# Patient Record
Sex: Female | Born: 1975 | Hispanic: Yes | Marital: Single | Smoking: Never smoker
Health system: Southern US, Community
[De-identification: ages and names within clinical notes are randomized; demographics above are authoritative.]

## PROBLEM LIST (undated history)

## (undated) DIAGNOSIS — G8929 Other chronic pain: Secondary | ICD-10-CM

## (undated) DIAGNOSIS — R51 Headache: Secondary | ICD-10-CM

## (undated) DIAGNOSIS — Q8500 Neurofibromatosis, unspecified: Secondary | ICD-10-CM

## (undated) HISTORY — PX: OVARY SURGERY: SHX727

---

## 2016-12-18 ENCOUNTER — Emergency Department: Payer: Medicaid Other

## 2016-12-18 ENCOUNTER — Observation Stay
Admission: EM | Admit: 2016-12-18 | Discharge: 2016-12-21 | Disposition: A | Discharge status: Home / Self Care | Payer: Medicaid Other | Attending: Internal Medicine | Admitting: Internal Medicine

## 2016-12-18 DIAGNOSIS — R109 Unspecified abdominal pain: Secondary | ICD-10-CM | POA: Diagnosis not present

## 2016-12-18 DIAGNOSIS — R9431 Abnormal electrocardiogram [ECG] [EKG]: Secondary | ICD-10-CM | POA: Insufficient documentation

## 2016-12-18 DIAGNOSIS — R079 Chest pain, unspecified: Secondary | ICD-10-CM | POA: Diagnosis present

## 2016-12-18 DIAGNOSIS — Q8500 Neurofibromatosis, unspecified: Secondary | ICD-10-CM | POA: Diagnosis not present

## 2016-12-18 DIAGNOSIS — R0789 Other chest pain: Secondary | ICD-10-CM | POA: Diagnosis not present

## 2016-12-18 DIAGNOSIS — M25511 Pain in right shoulder: Secondary | ICD-10-CM | POA: Diagnosis present

## 2016-12-18 DIAGNOSIS — R51 Headache: Secondary | ICD-10-CM | POA: Diagnosis not present

## 2016-12-18 HISTORY — DX: Neurofibromatosis, unspecified: Q85.00

## 2016-12-18 LAB — BASIC METABOLIC PANEL
Anion gap: 10 (ref 5–15)
BUN: 18 mg/dL (ref 6–20)
CALCIUM: 9.6 mg/dL (ref 8.9–10.3)
CO2: 19 mmol/L — ABNORMAL LOW (ref 22–32)
CREATININE: 0.63 mg/dL (ref 0.44–1.00)
Chloride: 112 mmol/L — ABNORMAL HIGH (ref 101–111)
GFR calc Af Amer: >60 mL/min (ref >60)
Glucose, Bld: 103 mg/dL — ABNORMAL HIGH (ref 65–99)
Potassium: 3.3 mmol/L — ABNORMAL LOW (ref 3.5–5.1)
SODIUM: 141 mmol/L (ref 135–145)

## 2016-12-18 LAB — HEPATIC FUNCTION PANEL
ALT: 15 U/L (ref 14–54)
AST: 21 U/L (ref 15–41)
Albumin: 5 g/dL (ref 3.5–5.0)
Alkaline Phosphatase: 63 U/L (ref 38–126)
BILIRUBIN TOTAL: 0.6 mg/dL (ref 0.3–1.2)
Total Protein: 8.3 g/dL — ABNORMAL HIGH (ref 6.5–8.1)

## 2016-12-18 LAB — HCG, QUANTITATIVE, PREGNANCY

## 2016-12-18 LAB — CBC
HCT: 43 % (ref 35.0–47.0)
Hemoglobin: 14.5 g/dL (ref 12.0–16.0)
MCH: 30.3 pg (ref 26.0–34.0)
MCHC: 33.7 g/dL (ref 32.0–36.0)
MCV: 89.9 fL (ref 80.0–100.0)
PLATELETS: 381 10*3/uL (ref 150–440)
RBC: 4.78 MIL/uL (ref 3.80–5.20)
RDW: 13.7 % (ref 11.5–14.5)
WBC: 11.3 10*3/uL — AB (ref 3.6–11.0)

## 2016-12-18 LAB — LIPASE, BLOOD: Lipase: 23 U/L (ref 11–51)

## 2016-12-18 LAB — FIBRIN DERIVATIVES D-DIMER (ARMC ONLY): FIBRIN DERIVATIVES D-DIMER (ARMC): 94.95 (ref 0.00–499.00)

## 2016-12-18 LAB — TROPONIN I: Troponin I: <0.03 ng/mL (ref <0.03)

## 2016-12-18 MED ORDER — ONDANSETRON HCL 4 MG/2ML IJ SOLN
4.0000 mg | Freq: Once | INTRAMUSCULAR | Status: AC
  Administered 2016-12-18: 4 mg via INTRAVENOUS

## 2016-12-18 MED ORDER — ONDANSETRON 4 MG PO TBDP: 4.0000 mg | ORAL_TABLET | Freq: Once | ORAL | Status: DC

## 2016-12-18 MED ORDER — MORPHINE SULFATE (PF) 2 MG/ML IV SOLN
2.0000 mg | Freq: Once | INTRAVENOUS | Status: AC
  Administered 2016-12-18: 2 mg via INTRAVENOUS
  Filled 2016-12-18: qty 1

## 2016-12-18 MED ORDER — ASPIRIN 81 MG PO CHEW
324.0000 mg | CHEWABLE_TABLET | Freq: Once | ORAL | Status: AC
  Administered 2016-12-18: 324 mg via ORAL
  Filled 2016-12-18: qty 4

## 2016-12-18 MED ORDER — ONDANSETRON HCL 4 MG/2ML IJ SOLN
INTRAMUSCULAR | Status: AC
  Administered 2016-12-18: 4 mg via INTRAVENOUS
  Filled 2016-12-18: qty 2

## 2016-12-18 NOTE — ED Notes (Signed)
Pt went to CT

## 2016-12-18 NOTE — ED Provider Notes (Signed)
Reviewed triage EKG. No STEMI, but concerning EKG abnormalities with no previous for comparison. I requested triage team placed the patient to an acute care treatment bed as soon as bed becomes available   Sharyn CreamerMark Lasondra Hodgkins, MD 12/18/16 2203

## 2016-12-18 NOTE — ED Notes (Signed)
Pt returned from CT °

## 2016-12-18 NOTE — ED Provider Notes (Signed)
[facility] Emergency Department Provider Note   ____________________________________________   None    (approximate)  I have reviewed the triage vital signs and the nursing notes.  Spanish interpreter, [employer] utilize  HISTORY  Chief Complaint Shoulder Pain (right) and Near Syncope    HPI Denise Curry is a 41 y.o. female pertinent history of migraines  Patient reports that for about a week she's been noticing some pain and discomfort in her right shoulder area. Today the pain worsened. Nothing seemed to make it worse. She reports pain is in her right upper chest that radiates down into her arm with a tingling sensation. She also gets frequent "migraines" and is followed by her primary for this, reports she began having a throbbing headache today as well, but reports she is here because of pain in her right upper chest and arm  Social reports some slight discomfort in her right upper abdomen. Occasional nausea. She felt at one point earlier that she was very dizzy and felt as though she could pass out.  She does experience chest pain somewhat hard describe, sharp and heavy at times in the right upper chest  Denies pregnancy. No vaginal bleeding.  No shortness of breath. No leg swelling. No recent long trips or travels. No history of any blood clots.  Past Medical History:  Diagnosis Date  . 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 Chest pain 12/19/2016    Past Surgical History:  Procedure Laterality Date  . CESAREAN SECTION      Prior to Admission medications   Medication Sig Start Date End Date Taking? Authorizing Provider  cyclobenzaprine (FLEXERIL) 5 MG tablet Take 1 tablet (5 mg total) by mouth 3 (three) times daily as needed for muscle spasms. 12/21/16   Altamese Dilling, MD    Allergies Patient has no known allergies.  Family History  Problem Relation Age of Onset  .  Neurofibromatosis Son     Social History Social History  Substance Use Topics  . Smoking status: Never Smoker  . Smokeless tobacco: Never Used  . Alcohol use No    Review of Systems Constitutional: No fever/chills Eyes: No visual changes. ENT: No sore throat. Cardiovascular: See history of present illness Respiratory: Denies shortness of breath. Gastrointestinal: No vomiting.  No diarrhea.  No constipation. Genitourinary: Negative for dysuria. Musculoskeletal: Negative for back pain. Skin: Negative for rash. Neurological: Negative for headaches, focal weakness or numbness.  10-point ROS otherwise negative.  ____________________________________________   PHYSICAL EXAM:  VITAL SIGNS: ED Triage Vitals  Enc Vitals Group     BP 12/18/16 2204 134/87     Pulse Rate 12/18/16 2204 92     Resp 12/18/16 2204 18     Temp 12/18/16 2204 98.4 F (36.9 C)     Temp Source 12/18/16 2204 Oral     SpO2 12/18/16 2204 98 %     Weight 12/18/16 2158 100 lb (45.4 kg)     Height 12/18/16 2158 5' (1.524 m)     Head Circumference --      Peak Flow --      Pain Score 12/18/16 2157 10     Pain Loc --      Pain Edu? --      Excl. in GC? --     Constitutional: Alert and oriented. Well appearing and in no acute distress. Eyes: Conjunctivae are normal. PERRL. EOMI. Head: Atraumatic. Nose: No  congestion/rhinnorhea. Mouth/Throat: Mucous membranes are moist.  Oropharynx non-erythematous. Neck: No stridor.   Cardiovascular: Normal rate, regular rhythm. Grossly normal heart sounds.  Good peripheral circulation. Respiratory: Normal respiratory effort.  No retractions. Lungs CTAB. Gastrointestinal: Soft and nontender. No distention.  Musculoskeletal: No lower extremity tenderness nor edema.  No joint effusions.Strong, palpable radial pulses bilateral. Right hand Median, ulnar, radial motor intact. Cap refill less than 2 seconds all digits. Strong radial pulse. 5 out of 5 strength throughout the  hand intrinsics, flexion and extension at the wrist. No evidence of trauma.  Left hand Median, ulnar, radial motor intact. Cap refill less than 2 seconds all digits. Strong radial pulse. 5 out of 5 strength throughout the hand intrinsics, flexion and extension at the wrist. No evidence of trauma. ____________________________________________  Neurologic:  Normal speech and language. No gross focal neurologic deficits are appreciated. Skin:  Skin is warm, dry and intact. No rash noted. Psychiatric: Mood and affect are normal. Speech and behavior are normal.  ____________________________________________   LABS (all labs ordered are listed, but only abnormal results are displayed)  Labs Reviewed  BASIC METABOLIC PANEL - Abnormal; Notable for the following:       Result Value   Potassium 3.3 (*)    Chloride 112 (*)    CO2 19 (*)    Glucose, Bld 103 (*)    All other components within normal limits  CBC - Abnormal; Notable for the following:    WBC 11.3 (*)    All other components within normal limits  URINALYSIS, COMPLETE (UACMP) WITH MICROSCOPIC - Abnormal; Notable for the following:    Color, Urine AMBER (*)    APPearance HAZY (*)    Ketones, ur 20 (*)    Protein, ur 30 (*)    Squamous Epithelial / LPF 0-5 (*)    All other components within normal limits  HEPATIC FUNCTION PANEL - Abnormal; Notable for the following:    Total Protein 8.3 (*)    Bilirubin, Direct <0.1 (*)    All other components within normal limits  TROPONIN I  HCG, QUANTITATIVE, PREGNANCY  FIBRIN DERIVATIVES D-DIMER ([facility] ONLY)  LIPASE, BLOOD  HIV ANTIBODY (ROUTINE TESTING)  TSH  HEMOGLOBIN A1C  TROPONIN I  TROPONIN I  TROPONIN I  CBC  CREATININE, SERUM   ____________________________________________  EKG  Reviewed and interpreted by me at 2158 Ventricular rate 80 QRS 80 QTc 440 Normal sinus rhythm, notable T-wave inversions in inferior and anteroseptal and slight lateral distribution. No previous  for comparison, concerning for possible ischemic abnormality. Evidence of STEMI. ____________________________________________  RADIOLOGY  Dg Chest 2 View  Result Date: 12/19/2016 CLINICAL DATA:  Dizziness and RIGHT chest pain. Pain radiates to RIGHT arm for 1 day. History of neurofibromatosis. EXAM: CHEST  2 VIEW COMPARISON:  None. FINDINGS: Cardiomediastinal silhouette is normal. No pleural effusions or focal consolidations. Trachea projects midline and there is no pneumothorax. Soft tissue planes and included osseous structures are non-suspicious. IMPRESSION: Normal chest. Electronically Signed   By: Awilda Metro M.D.   On: 12/19/2016 00:21   US Abdomen Limited Ruq  Result Date: 12/19/2016 CLINICAL DATA:  Right shoulder pain with nausea vomiting and dizziness EXAM: US ABDOMEN LIMITED - RIGHT UPPER QUADRANT COMPARISON:  None. FINDINGS: Gallbladder: No gallstones or wall thickening visualized. No sonographic Murphy sign noted by sonographer. Common bile duct: Diameter: Borderline enlarged at 6.7 mm Liver: No focal lesion identified. Within normal limits in parenchymal echogenicity. IMPRESSION: 1. Negative for gallstones or cholecystitis 2.  Borderline enlarged common bile duct, suggest correlation with laboratory values. Electronically Signed   By: Jasmine PangKim  Fujinaga M.D.   On: 12/19/2016 01:56   ____________________________________________   PROCEDURES  Procedure(s) performed: None  Procedures  Critical Care performed: No  ____________________________________________   INITIAL IMPRESSION / ASSESSMENT AND PLAN / ED COURSE  Pertinent labs & imaging results that were available during my care of the patient were reviewed by me and considered in my medical decision making (see chart for details).  Patient has for evaluation of right arm discomfort somewhat off and on for at least a week, now with worsening today located in the right upper chest. Unclear if this is associated as his symptoms  are very right-sided, but her EKG is notably abnormal. Possibly indicative of coronary disease or other process leading to a acute EKG or melena. She has no risk factors noted for pulmonary embolism, her d-dimer is negative, and she is also notably PERC negative as well again the risk of pulmonary wasn't very low. Denies pleuritic pain. Does have some abdominal discomfort in the right upper quadrant. We'll evaluate with right upper quadrant ultrasound LFTs, to evaluate for referred discomfort.  ----------------------------------------- 11:31 PM on 12/18/2016 -----------------------------------------  Patient is resting, mild ongoing right sided arm discomfort. No evidence of any neurologic deficits. Moves extremities well without loss of sensation. Normal strength. I have ordered aspirin. Chest x-ray and right upper quadrant ultrasound are pending.  Clinical Course as of Dec 24 1613  Sat Dec 19, 2016  0216 1. Negative for gallstones or cholecystitis 2. Borderline enlarged common bile duct, suggest correlation with laboratory values.   US Abdomen Limited RUQ [AW]  0218 Normal chest. DG Chest 2 View [AW]    Clinical Course User Index [AW] Rebecka ApleyWebster, Allison P, MD   Patient admitted due to abnormal EKG, associated symptoms including potential radiation of shoulder pain  ____________________________________________   FINAL CLINICAL IMPRESSION(S) / ED DIAGNOSES  Final diagnoses:  Abdominal pain  Right shoulder pain, unspecified chronicity      NEW MEDICATIONS STARTED DURING THIS VISIT:  Discharge Medication List as of 12/21/2016  5:24 PM       Note:  This document was prepared using Dragon voice recognition software and may include unintentional dictation errors.     Sharyn CreamerQuale, Mckaela Howley, MD 12/24/16 [phone]

## 2016-12-18 NOTE — ED Triage Notes (Signed)
Patient c/o headache, right shoulder pain, dizziness, near syncope, N/V.

## 2016-12-19 ENCOUNTER — Emergency Department: Payer: Medicaid Other

## 2016-12-19 ENCOUNTER — Observation Stay (HOSPITAL_BASED_OUTPATIENT_CLINIC_OR_DEPARTMENT_OTHER)
Admit: 2016-12-19 | Discharge: 2016-12-19 | Disposition: A | Discharge status: Home / Self Care | Payer: Medicaid Other | Attending: Internal Medicine | Admitting: Internal Medicine

## 2016-12-19 ENCOUNTER — Encounter: Payer: Self-pay | Admitting: Emergency Medicine

## 2016-12-19 DIAGNOSIS — R9431 Abnormal electrocardiogram [ECG] [EKG]: Secondary | ICD-10-CM | POA: Diagnosis not present

## 2016-12-19 DIAGNOSIS — R0789 Other chest pain: Secondary | ICD-10-CM

## 2016-12-19 DIAGNOSIS — R079 Chest pain, unspecified: Secondary | ICD-10-CM | POA: Diagnosis present

## 2016-12-19 DIAGNOSIS — R0602 Shortness of breath: Secondary | ICD-10-CM | POA: Diagnosis not present

## 2016-12-19 LAB — URINALYSIS, COMPLETE (UACMP) WITH MICROSCOPIC
Bacteria, UA: NONE SEEN
Bilirubin Urine: NEGATIVE
Glucose, UA: NEGATIVE mg/dL
Hgb urine dipstick: NEGATIVE
Ketones, ur: 20 mg/dL — AB
Leukocytes, UA: NEGATIVE
Nitrite: NEGATIVE
Protein, ur: 30 mg/dL — AB
Specific Gravity, Urine: 1.028 (ref 1.005–1.030)
pH: 5 (ref 5.0–8.0)

## 2016-12-19 LAB — TROPONIN I
Troponin I: <0.03 ng/mL (ref <0.03)
Troponin I: <0.03 ng/mL (ref <0.03)
Troponin I: <0.03 ng/mL (ref <0.03)

## 2016-12-19 LAB — ECHOCARDIOGRAM COMPLETE
Height: 60 in
Weight: 1584 oz

## 2016-12-19 LAB — TSH: TSH: 1.061 u[IU]/mL (ref 0.350–4.500)

## 2016-12-19 MED ORDER — CYCLOBENZAPRINE HCL 5 MG PO TABS: 5.0000 mg | ORAL_TABLET | Freq: Three times a day (TID) | ORAL | 0 refills | Status: DC | PRN

## 2016-12-19 MED ORDER — CYCLOBENZAPRINE HCL 10 MG PO TABS
5.0000 mg | ORAL_TABLET | Freq: Three times a day (TID) | ORAL | Status: DC | PRN
  Administered 2016-12-19 – 2016-12-20 (×2): 5 mg via ORAL
  Filled 2016-12-19 (×2): qty 1

## 2016-12-19 MED ORDER — ONDANSETRON HCL 4 MG/2ML IJ SOLN: 4.0000 mg | Freq: Four times a day (QID) | INTRAMUSCULAR | Status: DC | PRN

## 2016-12-19 MED ORDER — SODIUM CHLORIDE 0.9 % IV SOLN
INTRAVENOUS | Status: DC
  Administered 2016-12-19 – 2016-12-21 (×5): via INTRAVENOUS

## 2016-12-19 MED ORDER — ACETAMINOPHEN 325 MG PO TABS
650.0000 mg | ORAL_TABLET | Freq: Four times a day (QID) | ORAL | Status: DC | PRN
  Administered 2016-12-19 – 2016-12-21 (×6): 650 mg via ORAL
  Filled 2016-12-19 (×6): qty 2

## 2016-12-19 MED ORDER — ENOXAPARIN SODIUM 40 MG/0.4ML ~~LOC~~ SOLN
40.0000 mg | SUBCUTANEOUS | Status: DC
  Administered 2016-12-19 – 2016-12-20 (×2): 40 mg via SUBCUTANEOUS
  Filled 2016-12-19 (×2): qty 0.4

## 2016-12-19 MED ORDER — ONDANSETRON HCL 4 MG PO TABS: 4.0000 mg | ORAL_TABLET | Freq: Four times a day (QID) | ORAL | Status: DC | PRN

## 2016-12-19 MED ORDER — DOCUSATE SODIUM 100 MG PO CAPS
100.0000 mg | ORAL_CAPSULE | Freq: Two times a day (BID) | ORAL | Status: DC
  Administered 2016-12-19 – 2016-12-20 (×3): 100 mg via ORAL
  Filled 2016-12-19 (×4): qty 1

## 2016-12-19 MED ORDER — ACETAMINOPHEN 650 MG RE SUPP: 650.0000 mg | Freq: Four times a day (QID) | RECTAL | Status: DC | PRN

## 2016-12-19 MED ORDER — ASPIRIN EC 81 MG PO TBEC
81.0000 mg | DELAYED_RELEASE_TABLET | Freq: Every day | ORAL | Status: DC
  Administered 2016-12-19 – 2016-12-20 (×2): 81 mg via ORAL
  Filled 2016-12-19 (×2): qty 1

## 2016-12-19 NOTE — Progress Notes (Signed)
*  PRELIMINARY RESULTS* Echocardiogram 2D Echocardiogram has been performed.  Denise Curry 12/19/2016, 10:40 AM

## 2016-12-19 NOTE — H&P (Signed)
Denise Curry is an 41 y.o. female.   Chief Complaint: Shoulder pain HPI: The patient with past medical history of neurofibromatosis presents to the emergency department complaining of right shoulder pain. The patient states that her right shoulder began to hurt 4 days ago. The pain in her right shoulder extends into her chest and radiates down her right arm. The patient states that her pain is 10 out of 10 in severity and associated at times with nausea, vomiting and dizziness. Her pain has waxed and waned but never gone away for 5 days now. She states that moving her right arm makes her pain worse but nothing makes it better. The pain is present with normal activity or with rest area the patient denies any strenuous activity lately. She admits to occasional shortness of breath with or without exertion. She reports feeling faint at times when her shortness of breath is greatest. Notably, all of her symptoms began days ago with a right frontal headache that has not gone away. In the emergency department the patient had an ultrasound of her abdomen to rule out cholecystitis given her history of nausea and vomiting. She also had a head CT which showed no acute intracranial abnormality. Troponins have been negative during ED evaluation but her EKG shows ST inversions of her precordial leads. For this reason as well as ongoing chest pain the emergency department staff called the hospitalist service for admission.  Past Medical History:  Diagnosis Date  . 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 CESAREAN SECTION      Family History  Problem Relation Age of Onset  . Neurofibromatosis Son    Social History:  reports that she has never smoked. She has never used smokeless tobacco. She reports that she does not drink alcohol. Her drug history is not on file.  Allergies: No Known Allergies  No prescriptions prior to admission.    Results for orders  placed or performed during the hospital encounter of 12/18/16 (from the past 48 hour(s))  Basic metabolic panel     Status: Abnormal   Collection Time: 12/18/16 10:08 PM  Result Value Ref Range   Sodium 141 135 - 145 mmol/L   Potassium 3.3 (L) 3.5 - 5.1 mmol/L   Chloride 112 (H) 101 - 111 mmol/L   CO2 19 (L) 22 - 32 mmol/L   Glucose, Bld 103 (H) 65 - 99 mg/dL   BUN 18 6 - 20 mg/dL   Creatinine, Ser 0.63 0.44 - 1.00 mg/dL   Calcium 9.6 8.9 - 10.3 mg/dL   GFR calc non Af Amer >60 >60 mL/min   GFR calc Af Amer >60 >60 mL/min    Comment: (NOTE) The eGFR has been calculated using the CKD EPI equation. This calculation has not been validated in all clinical situations. eGFR's persistently <60 mL/min signify possible Chronic Kidney Disease.    Anion gap 10 5 - 15  CBC     Status: Abnormal   Collection Time: 12/18/16 10:08 PM  Result Value Ref Range   WBC 11.3 (H) 3.6 - 11.0 K/uL   RBC 4.78 3.80 - 5.20 MIL/uL   Hemoglobin 14.5 12.0 - 16.0 g/dL   HCT 43.0 35.0 - 47.0 %   MCV 89.9 80.0 - 100.0 fL   MCH 30.3 26.0 - 34.0 pg   MCHC 33.7 32.0 - 36.0 g/dL   RDW 13.7 11.5 - 14.5 %   Platelets 381 150 - 440  K/uL  Troponin I     Status: None   Collection Time: 12/18/16 10:08 PM  Result Value Ref Range   Troponin I <0.03 <0.03 ng/mL  hCG, quantitative, pregnancy     Status: None   Collection Time: 12/18/16 10:08 PM  Result Value Ref Range   hCG, Beta Chain, Quant, S <1 <5 mIU/mL    Comment:          GEST. AGE      CONC.  (mIU/mL)   <=1 WEEK        5 - 50     2 WEEKS       50 - 500     3 WEEKS       100 - 10,000     4 WEEKS     1,000 - 30,000     5 WEEKS     3,500 - 115,000   6-8 WEEKS     12,000 - 270,000    12 WEEKS     15,000 - 220,000        FEMALE AND NON-PREGNANT FEMALE:     LESS THAN 5 mIU/mL   Fibrin derivatives D-Dimer ([facility] only)     Status: None   Collection Time: 12/18/16 10:08 PM  Result Value Ref Range   Fibrin derivatives D-dimer ([facility]) 94.95 0.00 - 499.00     Comment: (NOTE) <> Exclusion of Venous Thromboembolism (VTE) - OUTPATIENT ONLY   (Emergency Department or [place])   0-499 ng/ml (FEU): With a low to intermediate pretest probability                      for VTE this test result excludes the diagnosis                      of VTE.   >499 ng/ml (FEU) : VTE not excluded; additional work up for VTE is                      required. <> Testing on Inpatients and Evaluation of Disseminated Intravascular   Coagulation ([facility]) Reference Range:   0-499 ng/ml (FEU)   Hepatic function panel     Status: Abnormal   Collection Time: 12/18/16 10:08 PM  Result Value Ref Range   Total Protein 8.3 (H) 6.5 - 8.1 g/dL   Albumin 5.0 3.5 - 5.0 g/dL   AST 21 15 - 41 U/L   ALT 15 14 - 54 U/L   Alkaline Phosphatase 63 38 - 126 U/L   Total Bilirubin 0.6 0.3 - 1.2 mg/dL   Bilirubin, Direct <0.1 (L) 0.1 - 0.5 mg/dL   Indirect Bilirubin NOT CALCULATED 0.3 - 0.9 mg/dL  Lipase, blood     Status: None   Collection Time: 12/18/16 10:08 PM  Result Value Ref Range   Lipase 23 11 - 51 U/L  TSH     Status: None   Collection Time: 12/19/16  4:17 AM  Result Value Ref Range   TSH 1.061 0.350 - 4.500 uIU/mL    Comment: Performed by a 3rd Generation assay with a functional sensitivity of <=0.01 uIU/mL.  Troponin I     Status: None   Collection Time: 12/19/16  4:17 AM  Result Value Ref Range   Troponin I <0.03 <0.03 ng/mL   Dg Chest 2 View  Result Date: 12/19/2016 CLINICAL DATA:  Dizziness and RIGHT chest pain. Pain radiates to RIGHT arm for 1 day. History of  neurofibromatosis. EXAM: CHEST  2 VIEW COMPARISON:  None. FINDINGS: Cardiomediastinal silhouette is normal. No pleural effusions or focal consolidations. Trachea projects midline and there is no pneumothorax. Soft tissue planes and included osseous structures are non-suspicious. IMPRESSION: Normal chest. Electronically Signed   By: Elon Alas M.D.   On: 12/19/2016 00:21   Ct Head Wo Contrast  Result Date:  12/18/2016 CLINICAL DATA:  Headache dizziness near syncope EXAM: CT HEAD WITHOUT CONTRAST TECHNIQUE: Contiguous axial images were obtained from the base of the skull through the vertex without intravenous contrast. COMPARISON:  None. FINDINGS: Brain: No evidence of acute infarction, hemorrhage, hydrocephalus, extra-axial collection or mass lesion/mass effect. Vascular: No hyperdense vessel or unexpected calcification. Skull: Normal. Negative for fracture or focal lesion. Sinuses/Orbits: No acute finding. Other: None IMPRESSION: No CT evidence for acute intracranial abnormality. Electronically Signed   By: Donavan Foil M.D.   On: 12/18/2016 23:15   US Abdomen Limited Ruq  Result Date: 12/19/2016 CLINICAL DATA:  Right shoulder pain with nausea vomiting and dizziness EXAM: US ABDOMEN LIMITED - RIGHT UPPER QUADRANT COMPARISON:  None. FINDINGS: Gallbladder: No gallstones or wall thickening visualized. No sonographic Murphy sign noted by sonographer. Common bile duct: Diameter: Borderline enlarged at 6.7 mm Liver: No focal lesion identified. Within normal limits in parenchymal echogenicity. IMPRESSION: 1. Negative for gallstones or cholecystitis 2. Borderline enlarged common bile duct, suggest correlation with laboratory values. Electronically Signed   By: Donavan Foil M.D.   On: 12/19/2016 01:56    Review of Systems  Constitutional: Negative for chills and fever.  HENT: Negative for sore throat and tinnitus.   Eyes: Negative for blurred vision and redness.  Respiratory: Positive for shortness of breath. Negative for cough.   Cardiovascular: Positive for chest pain. Negative for palpitations, orthopnea and PND.  Gastrointestinal: Positive for abdominal pain, nausea and vomiting. Negative for diarrhea.  Genitourinary: Negative for dysuria, frequency and urgency.  Musculoskeletal: Negative for joint pain and myalgias.  Skin: Negative for rash.       No lesions  Neurological: Positive for headaches.  Negative for speech change, focal weakness and weakness.  Endo/Heme/Allergies: Does not bruise/bleed easily.       No temperature intolerance  Psychiatric/Behavioral: Negative for depression and suicidal ideas.    Blood pressure 134/81, pulse 73, temperature 98.4 F (36.9 C), temperature source Oral, resp. rate 16, height 5' (1.524 m), weight 44.9 kg (99 lb), SpO2 100 %, unknown if currently breastfeeding. Physical Exam  Vitals reviewed. Constitutional: She is oriented to person, place, and time. She appears well-developed and well-nourished. No distress.  HENT:  Head: Normocephalic and atraumatic.  Mouth/Throat: Oropharynx is clear and moist.  Eyes: Conjunctivae and EOM are normal. Pupils are equal, round, and reactive to light. No scleral icterus.  Neck: Normal range of motion. Neck supple. No JVD present. No tracheal deviation present. No thyromegaly present.  Cardiovascular: Normal rate, regular rhythm and normal heart sounds.  Exam reveals no gallop and no friction rub.   No murmur heard. Respiratory: Effort normal and breath sounds normal.  GI: Soft. Bowel sounds are normal. She exhibits no distension. There is no tenderness.  Genitourinary:  Genitourinary Comments: Deferred  Musculoskeletal: Normal range of motion. She exhibits no edema.  Fibroma anterior right thigh  Lymphadenopathy:    She has no cervical adenopathy.  Neurological: She is alert and oriented to person, place, and time. No cranial nerve deficit. She exhibits normal muscle tone.  Skin: Skin is warm and dry. No rash noted.  No erythema.  Psychiatric: She has a normal mood and affect. Her behavior is normal. Judgment and thought content normal.     Assessment/Plan This is a 41 year old female admitted for chest pain and near syncope. 1. Chest pain: Associated with near syncope. Atypical induration and radiation of pain. EKG shows ST depressions in leads V1 through V6 but she has no tracings for comparison. Risk  factors for heart disease are low but due to ongoing chest pain we will have cardiology see the patient. Continue to follow cardiac biomarkers. Monitor telemetry. Echocardiogram ordered. I have started the patient on aspirin 2. 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 DVT prophylaxis: Lovenox 4. GI prophylaxis: None The patient is a full code. Time spent on admission orders and patient care approximately 45 minutes  Harrie Foreman, MD 12/19/2016, 6:55 AM

## 2016-12-19 NOTE — ED Notes (Signed)
Pt's pain assessed; pt states pain at 9/10; MD notified; no new orders received.

## 2016-12-19 NOTE — Progress Notes (Signed)
[employer] - [place] at [facility]   PATIENT NAME: Denise Curry    MR#:  [id number]  DATE OF BIRTH:  03/30/1976  SUBJECTIVE:  CHIEF COMPLAINT:   Chief Complaint  Patient presents with  . Shoulder Pain    right  . Near Syncope    Came to hospital with right-sided chest, shoulder, arm pain, which is constant for last few days getting worse significantly and worsening with any activities or movements. She denies any chest pain on walking or climbing up stairs. 2 troponins negative, negative on telemetry monitoring, EKG shows some changes.  REVIEW OF SYSTEMS:  CONSTITUTIONAL: No fever, fatigue or weakness.  EYES: No blurred or double vision.  EARS, NOSE, AND THROAT: No tinnitus or ear pain.  RESPIRATORY: No cough, shortness of breath, wheezing or hemoptysis.  CARDIOVASCULAR: No chest pain, orthopnea, edema.  GASTROINTESTINAL: No nausea, vomiting, diarrhea or abdominal pain.  GENITOURINARY: No dysuria, hematuria.  ENDOCRINE: No polyuria, nocturia,  HEMATOLOGY: No anemia, easy bruising or bleeding SKIN: No rash or lesion. MUSCULOSKELETAL: No joint pain or arthritis.   NEUROLOGIC: No tingling, numbness, weakness.  PSYCHIATRY: No anxiety or depression.   ROS  DRUG ALLERGIES:  No Known Allergies  VITALS:  Blood pressure 114/64, pulse 67, temperature 98 F (36.7 C), temperature source Oral, resp. rate 16, height 5' (1.524 m), weight 44.9 kg (99 lb), SpO2 100 %, unknown if currently breastfeeding.  PHYSICAL EXAMINATION:  GENERAL:  41 y.o.-year-old patient lying in the bed with no acute distress.  EYES: Pupils equal, round, reactive to light and accommodation. No scleral icterus. Extraocular muscles intact.  HEENT: Head atraumatic, normocephalic. Oropharynx and nasopharynx clear.  NECK:  Supple, no jugular venous distention. No thyroid enlargement, no tenderness.  LUNGS: Normal breath sounds bilaterally, no wheezing, rales,rhonchi or crepitation. No use  of accessory muscles of respiration.  CARDIOVASCULAR: S1, S2 normal. No murmurs, rubs, or gallops.  ABDOMEN: Soft, nontender, nondistended. Bowel sounds present. No organomegaly or mass.  EXTREMITIES: No pedal edema, cyanosis, or clubbing.  NEUROLOGIC: Cranial nerves II through XII are intact. Muscle strength 5/5 in all extremities. Sensation intact. Gait not checked.  PSYCHIATRIC: The patient is alert and oriented x 3.  SKIN: No obvious rash, lesion, or ulcer.   Physical Exam LABORATORY PANEL:   CBC  Recent Labs Lab 12/18/16 2208  WBC 11.3*  HGB 14.5  HCT 43.0  PLT 381   ------------------------------------------------------------------------------------------------------------------  Chemistries   Recent Labs Lab 12/18/16 2208  NA 141  K 3.3*  CL 112*  CO2 19*  GLUCOSE 103*  BUN 18  CREATININE 0.63  CALCIUM 9.6  AST 21  ALT 15  ALKPHOS 63  BILITOT 0.6   ------------------------------------------------------------------------------------------------------------------  Cardiac Enzymes  Recent Labs Lab 12/19/16 0417 12/19/16 0917  TROPONINI <0.03 <0.03   ------------------------------------------------------------------------------------------------------------------  RADIOLOGY:  Dg Chest 2 View  Result Date: 12/19/2016 CLINICAL DATA:  Dizziness and RIGHT chest pain. Pain radiates to RIGHT arm for 1 day. History of neurofibromatosis. EXAM: CHEST  2 VIEW COMPARISON:  None. FINDINGS: Cardiomediastinal silhouette is normal. No pleural effusions or focal consolidations. Trachea projects midline and there is no pneumothorax. Soft tissue planes and included osseous structures are non-suspicious. IMPRESSION: Normal chest. Electronically Signed   By: Awilda Metroourtnay  Bloomer M.D.   On: 12/19/2016 00:21   Ct Head Wo Contrast  Result Date: 12/18/2016 CLINICAL DATA:  Headache dizziness near syncope EXAM: CT HEAD WITHOUT CONTRAST TECHNIQUE: Contiguous axial images were obtained  from the base of the skull  through the vertex without intravenous contrast. COMPARISON:  None. FINDINGS: Brain: No evidence of acute infarction, hemorrhage, hydrocephalus, extra-axial collection or mass lesion/mass effect. Vascular: No hyperdense vessel or unexpected calcification. Skull: Normal. Negative for fracture or focal lesion. Sinuses/Orbits: No acute finding. Other: None IMPRESSION: No CT evidence for acute intracranial abnormality. Electronically Signed   By: Jasmine Pang M.D.   On: 12/18/2016 23:15   US Abdomen Limited Ruq  Result Date: 12/19/2016 CLINICAL DATA:  Right shoulder pain with nausea vomiting and dizziness EXAM: US ABDOMEN LIMITED - RIGHT UPPER QUADRANT COMPARISON:  None. FINDINGS: Gallbladder: No gallstones or wall thickening visualized. No sonographic Murphy sign noted by sonographer. Common bile duct: Diameter: Borderline enlarged at 6.7 mm Liver: No focal lesion identified. Within normal limits in parenchymal echogenicity. IMPRESSION: 1. Negative for gallstones or cholecystitis 2. Borderline enlarged common bile duct, suggest correlation with laboratory values. Electronically Signed   By: Jasmine Pang M.D.   On: 12/19/2016 01:56    ASSESSMENT AND PLAN:   Active Problems:   Chest pain  * Chest pain-  atypical.   Serial troponins are negative, no abnormalities on telemetry monitoring.   Pain persists as per patient which is better by certain positioning of her arm while lying in bed.   Due to EKG changes, cardiology suggested to rule out coronary artery disease by stress test.   No previous EKG for comparison.  * Neurofibromatosis * DVT and GI prophylaxis.   All the records are reviewed and case discussed with Care Management/Social Workerr. Management plans discussed with the patient, family and they are in agreement.  CODE STATUS: Full.  TOTAL TIME TAKING CARE OF THIS PATIENT: 35 minutes.   POSSIBLE D/C IN 1-2 DAYS, DEPENDING ON CLINICAL  CONDITION.   Altamese Dilling M.D on 12/19/2016   Between 7am to 6pm - Pager - [phone]  After 6pm go to [email or web address] - password [email or web address]  [facility] [place] [facility]  Office  [phone]  CC: Primary care physician; Patient, No Pcp Per  Note: This dictation was prepared with Dragon dictation along with smaller phrase technology. Any transcriptional errors that result from this process are unintentional.

## 2016-12-19 NOTE — ED Provider Notes (Signed)
-----------------------------------------   2:17 AM on 12/19/2016 -----------------------------------------   Blood pressure 120/87, pulse 80, temperature 98.4 F (36.9 C), temperature source Oral, resp. rate 20, height 5' (1.524 m), weight 100 lb (45.4 kg), SpO2 99 %, unknown if currently breastfeeding.  Assuming care from Dr. Fanny BienQuale.  In short, Denise Curry is a 41 y.o. female with a chief complaint of Shoulder Pain (right) and Near Syncope .  Refer to the original H&P for additional details.  The current plan of care is to follow up the results of the imaging studies.    Clinical Course as of Dec 19 216  Sat Dec 19, 2016  0216 1. Negative for gallstones or cholecystitis 2. Borderline enlarged common bile duct, suggest correlation with laboratory values.   US Abdomen Limited RUQ [AW]  0218 Normal chest. DG Chest 2 View [AW]    Clinical Course User Index [AW] Rebecka ApleyAllison P Currie Dennin, MD   The patient's ultrasound and chest x-ray are unremarkable. I will admit the patient to the hospitalist service for further evaluation of these EKG changes that she has at this time.   Rebecka ApleyAllison P Sacora Hawbaker, MD 12/19/16 [phone]

## 2016-12-19 NOTE — Consult Note (Signed)
Cardiology Consultation   Patient ID: Denise Curry; [id number]; 1975-08-26   Admit date: 12/18/2016 Date of Consult: 12/19/2016  Referring MD:  Dr. Elisabeth Pigeon  Patient Care Team: Patient, No Pcp Per as PCP - General (General Practice)    Reason for Consultation: Cp, abn EKG   History of Present Illness: Denise Curry is a 41 y.o. female with a hx of neurofibromatosis.  Interview was done with the help of medical interpreter Charlane Ferretti. Patient reports right shoulder pain, right chest pain, right arm pain. Symptoms were moderate to severe in intensity, on and off for a long period of time now. However, for a week before date of admission, she also developed significant headache. When the headache did not go away, she decided to go to the ER yesterday.  She also reports shortness of breath on and off for a while now. Also occurred with other symptoms of headache and right chest pain and right shoulder pain. Sometime last week, she couldn't do her usual walking because of significant headache as well as shortness of breath. Felt lightheaded as well. But no true syncope.    ROS:  Please see the history of present illness.  ROS All other ROS reviewed and negative.    Past Medical History:  Diagnosis Date  . Neurofibromatosis [facility])     Past Surgical History:  Procedure Laterality Date  . CESAREAN SECTION        Home Meds: Prior to Admission medications   Medication Sig Start Date End Date Taking? Authorizing Provider  cyclobenzaprine (FLEXERIL) 5 MG tablet Take 1 tablet (5 mg total) by mouth 3 (three) times daily as needed for muscle spasms. 12/19/16   Altamese Dilling, MD    Current Medications: . aspirin EC  81 mg Oral Daily  . docusate sodium  100 mg Oral BID  . enoxaparin (LOVENOX) injection  40 mg Subcutaneous Q24H     Allergies:   No Known Allergies  Social History:   The patient  reports that she has never smoked. She has never used  smokeless tobacco. She reports that she does not drink alcohol.    Family History:   The patient's family history includes Neurofibromatosis in her son.       PHYSICAL EXAM: VS:  BP 113/71 (BP Location: Left Arm)   Pulse 85   Temp 98.5 F (36.9 C) (Oral)   Resp 16   Ht 5' (1.524 m)   Wt 99 lb (44.9 kg)   LMP  (LMP Unknown)   SpO2 100%   Breastfeeding? Unknown   BMI 19.33 kg/m  , BMI Body mass index is 19.33 kg/m. GENERAL:  well developed, well nourished, not in acute distress HEENT: normocephalic, pink conjunctivae, anicteric sclerae, no xanthelasma, normal dentition, oropharynx clear NECK:  no neck vein engorgement, JVP normal, no hepatojugular reflux, carotid upstroke brisk and symmetric, no bruit, no thyromegaly, no lymphadenopathy LUNGS:  good respiratory effort, clear to auscultation bilaterally CV:  PMI not displaced, no thrills, no lifts, S1 and S2 within normal limits, no palpable S3 or S4, no murmurs, no rubs, no gallops ABD:  Soft, nontender, nondistended, normoactive bowel sounds, no abdominal aortic bruit, no hepatomegaly, no splenomegaly MS: nontender back, no kyphosis, no scoliosis, no joint deformities EXT:  2+ DP/PT pulses, no edema, no varicosities, no cyanosis, no clubbing SKIN: warm, nondiaphoretic, normal turgor, no ulcers NEUROPSYCH: alert, oriented to person, place, and time, sensory/motor grossly intact, normal mood, appropriate affect    EKG:  EKG from 12/18/2016 was personally reviewed by me and showed sinus rhythm, 79 BPM. Minimal ST depression into inversion in V3 to V6 as well as in the inferior leads. No previous EKG for comparison. There is a report of an EKG from care everywhere from 2010 with a print out of the report but no available tracing for review.  Labs:  Recent Labs  12/18/16 2208 12/19/16 0417 12/19/16 0917  TROPONINI <0.03 <0.03 <0.03   Lab Results  Component Value Date   WBC 11.3 (H) 12/18/2016   HGB 14.5 12/18/2016   HCT 43.0  12/18/2016   MCV 89.9 12/18/2016   PLT 381 12/18/2016    Recent Labs Lab 12/18/16 2208  NA 141  K 3.3*  CL 112*  CO2 19*  BUN 18  CREATININE 0.63  CALCIUM 9.6  PROT 8.3*  BILITOT 0.6  ALKPHOS 63  ALT 15  AST 21  GLUCOSE 103*   No results found for: CHOL, HDL, LDLCALC, TRIG No results found for: DDIMER  Radiology/Studies:  Dg Chest 2 View  Result Date: 12/19/2016 CLINICAL DATA:  Dizziness and RIGHT chest pain. Pain radiates to RIGHT arm for 1 day. History of neurofibromatosis. EXAM: CHEST  2 VIEW COMPARISON:  None. FINDINGS: Cardiomediastinal silhouette is normal. No pleural effusions or focal consolidations. Trachea projects midline and there is no pneumothorax. Soft tissue planes and included osseous structures are non-suspicious. IMPRESSION: Normal chest. Electronically Signed   By: Awilda Metroourtnay  Bloomer M.D.   On: 12/19/2016 00:21   Ct Head Wo Contrast  Result Date: 12/18/2016 CLINICAL DATA:  Headache dizziness near syncope EXAM: CT HEAD WITHOUT CONTRAST TECHNIQUE: Contiguous axial images were obtained from the base of the skull through the vertex without intravenous contrast. COMPARISON:  None. FINDINGS: Brain: No evidence of acute infarction, hemorrhage, hydrocephalus, extra-axial collection or mass lesion/mass effect. Vascular: No hyperdense vessel or unexpected calcification. Skull: Normal. Negative for fracture or focal lesion. Sinuses/Orbits: No acute finding. Other: None IMPRESSION: No CT evidence for acute intracranial abnormality. Electronically Signed   By: Jasmine PangKim  Fujinaga M.D.   On: 12/18/2016 23:15   [place] Abdomen Limited Ruq  Result Date: 12/19/2016 CLINICAL DATA:  Right shoulder pain with nausea vomiting and dizziness EXAM: US ABDOMEN LIMITED - RIGHT UPPER QUADRANT COMPARISON:  None. FINDINGS: Gallbladder: No gallstones or wall thickening visualized. No sonographic Murphy sign noted by sonographer. Common bile duct: Diameter: Borderline enlarged at 6.7 mm Liver: No focal  lesion identified. Within normal limits in parenchymal echogenicity. IMPRESSION: 1. Negative for gallstones or cholecystitis 2. Borderline enlarged common bile duct, suggest correlation with laboratory values. Electronically Signed   By: Jasmine PangKim  Fujinaga M.D.   On: 12/19/2016 01:56     PROBLEM LIST:  Active Problems:   Chest pain     ASSESSMENT AND PLAN: Patient presents with right-sided chest pain, atypical chest pain essentially Shortness of breath with exertion Abnormal EKG as noted above Unable group to retrieve old EKG for comparison Echocardiogram is essentially unremarkable She has been ruled out for ACS. She continues to have right-sided chest pain. Recommend further evaluation with stress test. Patient does not want to walk on the treadmill due to significant back pain. Will schedule for pharmacologic nuclear stress test.  I spent at least 50 minutes with the patient today and more than 50% of the time was spent counseling the patient and coordinating care.     Signed, Almond LintAileen Ronold Hardgrove, MD  12/19/2016 11:25 AM  [facility]

## 2016-12-19 NOTE — Progress Notes (Addendum)
texrt sent to Dr. Anne HahnWillis for stronger pain medication r/t  Patient having constant headache. Tylenol was given but head ache persisted. Awaiting response.

## 2016-12-20 LAB — HEMOGLOBIN A1C
Hgb A1c MFr Bld: 5.3 % (ref 4.8–5.6)
Mean Plasma Glucose: 105 mg/dL

## 2016-12-20 LAB — HIV ANTIBODY (ROUTINE TESTING W REFLEX): HIV Screen 4th Generation wRfx: NONREACTIVE

## 2016-12-20 MED ORDER — BUTALBITAL-APAP-CAFFEINE 50-325-40 MG PO TABS
1.0000 | ORAL_TABLET | Freq: Four times a day (QID) | ORAL | Status: DC | PRN
  Administered 2016-12-20 – 2016-12-21 (×2): 1 via ORAL
  Filled 2016-12-20 (×2): qty 1

## 2016-12-20 NOTE — Progress Notes (Signed)
Pt up in room. Gait steady. Showered. Signed myoview consent with interpreter present. Med with plain tylenol for h/a as she must have no caffeine. No nausea/appetite good.

## 2016-12-20 NOTE — Progress Notes (Signed)
[employer] - [place] at [facility]   PATIENT NAME: Denise Curry    MR#:  [id number]  DATE OF BIRTH:  August 05, 1976  SUBJECTIVE:  CHIEF COMPLAINT:   Chief Complaint  Patient presents with  . Shoulder Pain    right  . Near Syncope    Came to hospital with right-sided chest, shoulder, arm pain, which is constant for last few days getting worse significantly and worsening with any activities or movements. She denies any chest pain on walking or climbing up stairs. 2 troponins negative, negative on telemetry monitoring, EKG shows some changes.   Awaiting stress test on Monday.  REVIEW OF SYSTEMS:  CONSTITUTIONAL: No fever, fatigue or weakness.  EYES: No blurred or double vision.  EARS, NOSE, AND THROAT: No tinnitus or ear pain.  RESPIRATORY: No cough, shortness of breath, wheezing or hemoptysis.  CARDIOVASCULAR: No chest pain, orthopnea, edema.  GASTROINTESTINAL: No nausea, vomiting, diarrhea or abdominal pain.  GENITOURINARY: No dysuria, hematuria.  ENDOCRINE: No polyuria, nocturia,  HEMATOLOGY: No anemia, easy bruising or bleeding SKIN: No rash or lesion. MUSCULOSKELETAL: No joint pain or arthritis.   NEUROLOGIC: No tingling, numbness, weakness.  PSYCHIATRY: No anxiety or depression.   ROS  DRUG ALLERGIES:  No Known Allergies  VITALS:  Blood pressure 113/77, pulse 75, temperature 98 F (36.7 C), temperature source Oral, resp. rate 18, height 5' (1.524 m), weight 46.4 kg (102 lb 6 oz), SpO2 98 %, unknown if currently breastfeeding.  PHYSICAL EXAMINATION:  GENERAL:  41 y.o.-year-old patient lying in the bed with no acute distress.  EYES: Pupils equal, round, reactive to light and accommodation. No scleral icterus. Extraocular muscles intact.  HEENT: Head atraumatic, normocephalic. Oropharynx and nasopharynx clear.  NECK:  Supple, no jugular venous distention. No thyroid enlargement, no tenderness.  LUNGS: Normal breath sounds bilaterally, no  wheezing, rales,rhonchi or crepitation. No use of accessory muscles of respiration.  CARDIOVASCULAR: S1, S2 normal. No murmurs, rubs, or gallops.  ABDOMEN: Soft, nontender, nondistended. Bowel sounds present. No organomegaly or mass.  EXTREMITIES: No pedal edema, cyanosis, or clubbing.  NEUROLOGIC: Cranial nerves II through XII are intact. Muscle strength 5/5 in all extremities. Sensation intact. Gait not checked.  PSYCHIATRIC: The patient is alert and oriented x 3.  SKIN: No obvious rash, lesion, or ulcer.   Physical Exam LABORATORY PANEL:   CBC  Recent Labs Lab 12/18/16 2208  WBC 11.3*  HGB 14.5  HCT 43.0  PLT 381   ------------------------------------------------------------------------------------------------------------------  Chemistries   Recent Labs Lab 12/18/16 2208  NA 141  K 3.3*  CL 112*  CO2 19*  GLUCOSE 103*  BUN 18  CREATININE 0.63  CALCIUM 9.6  AST 21  ALT 15  ALKPHOS 63  BILITOT 0.6   ------------------------------------------------------------------------------------------------------------------  Cardiac Enzymes  Recent Labs Lab 12/19/16 0917 12/19/16 1553  TROPONINI <0.03 <0.03   ------------------------------------------------------------------------------------------------------------------  RADIOLOGY:  Dg Chest 2 View  Result Date: 12/19/2016 CLINICAL DATA:  Dizziness and RIGHT chest pain. Pain radiates to RIGHT arm for 1 day. History of neurofibromatosis. EXAM: CHEST  2 VIEW COMPARISON:  None. FINDINGS: Cardiomediastinal silhouette is normal. No pleural effusions or focal consolidations. Trachea projects midline and there is no pneumothorax. Soft tissue planes and included osseous structures are non-suspicious. IMPRESSION: Normal chest. Electronically Signed   By: Awilda Metro M.D.   On: 12/19/2016 00:21   Ct Head Wo Contrast  Result Date: 12/18/2016 CLINICAL DATA:  Headache dizziness near syncope EXAM: CT HEAD WITHOUT CONTRAST  TECHNIQUE: Contiguous axial  images were obtained from the base of the skull through the vertex without intravenous contrast. COMPARISON:  None. FINDINGS: Brain: No evidence of acute infarction, hemorrhage, hydrocephalus, extra-axial collection or mass lesion/mass effect. Vascular: No hyperdense vessel or unexpected calcification. Skull: Normal. Negative for fracture or focal lesion. Sinuses/Orbits: No acute finding. Other: None IMPRESSION: No CT evidence for acute intracranial abnormality. Electronically Signed   By: Jasmine PangKim  Fujinaga M.D.   On: 12/18/2016 23:15   [place] Abdomen Limited Ruq  Result Date: 12/19/2016 CLINICAL DATA:  Right shoulder pain with nausea vomiting and dizziness EXAM: US ABDOMEN LIMITED - RIGHT UPPER QUADRANT COMPARISON:  None. FINDINGS: Gallbladder: No gallstones or wall thickening visualized. No sonographic Murphy sign noted by sonographer. Common bile duct: Diameter: Borderline enlarged at 6.7 mm Liver: No focal lesion identified. Within normal limits in parenchymal echogenicity. IMPRESSION: 1. Negative for gallstones or cholecystitis 2. Borderline enlarged common bile duct, suggest correlation with laboratory values. Electronically Signed   By: Jasmine PangKim  Fujinaga M.D.   On: 12/19/2016 01:56    ASSESSMENT AND PLAN:   Active Problems:   Chest pain  * Chest pain-  atypical.   Serial troponins are negative, no abnormalities on telemetry monitoring.   Pain persists as per patient which is better by certain positioning of her arm while lying in bed.   Due to EKG changes, cardiology suggested to rule out coronary artery disease by stress test.   No previous EKG for comparison.      Awaited stress test on Monday. Pt remains pain free now.  * Neurofibromatosis * DVT and GI prophylaxis.   All the records are reviewed and case discussed with Care Management/Social Workerr. Management plans discussed with the patient, family and they are in agreement.  CODE STATUS: Full.  TOTAL TIME  TAKING CARE OF THIS PATIENT: 25 minutes.   POSSIBLE D/C IN 1-2 DAYS, DEPENDING ON CLINICAL CONDITION.   Altamese DillingVACHHANI, Welma Mccombs M.D on 12/20/2016   Between 7am to 6pm - Pager - [phone]  After 6pm go to [email or web address] - password [email or web address]  [facility] [place] [facility]  Office  [phone]  CC: Primary care physician; Patient, No Pcp Per  Note: This dictation was prepared with Dragon dictation along with smaller phrase technology. Any transcriptional errors that result from this process are unintentional.

## 2016-12-20 NOTE — Progress Notes (Signed)
New order per Dr. Sheryle Hailiamond for fioricet for headache.

## 2016-12-21 ENCOUNTER — Observation Stay (HOSPITAL_BASED_OUTPATIENT_CLINIC_OR_DEPARTMENT_OTHER): Payer: Medicaid Other

## 2016-12-21 DIAGNOSIS — R079 Chest pain, unspecified: Secondary | ICD-10-CM

## 2016-12-21 DIAGNOSIS — R9431 Abnormal electrocardiogram [ECG] [EKG]: Secondary | ICD-10-CM | POA: Diagnosis not present

## 2016-12-21 LAB — CBC
HEMATOCRIT: 36.5 % (ref 35.0–47.0)
Hemoglobin: 12.5 g/dL (ref 12.0–16.0)
MCH: 31.1 pg (ref 26.0–34.0)
MCHC: 34.3 g/dL (ref 32.0–36.0)
MCV: 90.4 fL (ref 80.0–100.0)
Platelets: 288 10*3/uL (ref 150–440)
RBC: 4.03 MIL/uL (ref 3.80–5.20)
RDW: 13.6 % (ref 11.5–14.5)
WBC: 6.4 10*3/uL (ref 3.6–11.0)

## 2016-12-21 LAB — NM MYOCAR MULTI W/SPECT W/WALL MOTION / EF
CSEPED: 1 min
CSEPEDS: 26 s
CSEPPHR: 121 {beats}/min
Estimated workload: 1 METS
LV dias vol: 35 mL (ref 46–106)
LV sys vol: 10 mL
Rest HR: 72 {beats}/min
SDS: 0
SRS: 0
SSS: 0
TID: 1.04

## 2016-12-21 LAB — CREATININE, SERUM: CREATININE: 0.47 mg/dL (ref 0.44–1.00)

## 2016-12-21 MED ORDER — TECHNETIUM TC 99M TETROFOSMIN IV KIT
13.0000 | PACK | Freq: Once | INTRAVENOUS | Status: AC | PRN
  Administered 2016-12-21: 12.851 via INTRAVENOUS

## 2016-12-21 MED ORDER — REGADENOSON 0.4 MG/5ML IV SOLN
0.4000 mg | Freq: Once | INTRAVENOUS | Status: AC
  Administered 2016-12-21: 0.4 mg via INTRAVENOUS

## 2016-12-21 MED ORDER — TECHNETIUM TC 99M TETROFOSMIN IV KIT
30.3200 | PACK | Freq: Once | INTRAVENOUS | Status: AC | PRN
  Administered 2016-12-21: 30.32 via INTRAVENOUS

## 2016-12-21 MED ORDER — CYCLOBENZAPRINE HCL 5 MG PO TABS: 5.0000 mg | ORAL_TABLET | Freq: Three times a day (TID) | ORAL | 0 refills | Status: DC | PRN

## 2016-12-21 NOTE — Progress Notes (Signed)
Pt. Slept well throughout the night. NPO past midnight. C/O pain x1, medicated with effective results. No SOB or acute distress noted.

## 2016-12-21 NOTE — Progress Notes (Addendum)
Stress test normal per Dr. Kirke CorinArida. Will notify Dr. Elpidio AnisSudini.   *1650 - Dr. Elisabeth PigeonVachhani to round on patient shortly.

## 2016-12-21 NOTE — Discharge Summary (Signed)
[facility] - [place] at [facility]   PATIENT NAME: Denise Curry    MR#:  [id number]  DATE OF BIRTH:  September 02, 1975  DATE OF ADMISSION:  12/18/2016 ADMITTING PHYSICIAN: Arnaldo Natal, MD  DATE OF DISCHARGE: 12/21/2016  PRIMARY CARE PHYSICIAN: Patient, No Pcp Per    ADMISSION DIAGNOSIS:  Abdominal pain [R10.9] Right shoulder pain, unspecified chronicity [M25.511]  DISCHARGE DIAGNOSIS:  Active Problems:   Chest pain    Nuclear medicine stress test is negative for ischemia.  SECONDARY DIAGNOSIS:   Past Medical History:  Diagnosis Date  . Neurofibromatosis [facility])     HOSPITAL COURSE:   * Chest pain-  atypical.   Serial troponins are negative, no abnormalities on telemetry monitoring.   Pain persists as per patient which is better by certain positioning of her arm while lying in bed.   Due to EKG changes, cardiology suggested to rule out coronary artery disease by stress test.   No previous EKG for comparison.    stress test is done 12/21/16- negative for ischemia.   Pain is on right side arm, likely muscular.  * Neurofibromatosis * DVT and GI prophylaxis.   DISCHARGE CONDITIONS:   Stable.  CONSULTS OBTAINED:  Treatment Team:  Almond Lint, MD  DRUG ALLERGIES:  No Known Allergies  DISCHARGE MEDICATIONS:   Current Discharge Medication List    START taking these medications   Details  cyclobenzaprine (FLEXERIL) 5 MG tablet Take 1 tablet (5 mg total) by mouth 3 (three) times daily as needed for muscle spasms. Qty: 20 tablet, Refills: 0         DISCHARGE INSTRUCTIONS:    Follow with PMD as needed.  If you experience worsening of your admission symptoms, develop shortness of breath, life threatening emergency, suicidal or homicidal thoughts you must seek medical attention immediately by calling 911 or calling your MD immediately  if symptoms less severe.  You Must read complete instructions/literature along with all the  possible adverse reactions/side effects for all the Medicines you take and that have been prescribed to you. Take any new Medicines after you have completely understood and accept all the possible adverse reactions/side effects.   Please note  You were cared for by a hospitalist during your hospital stay. If you have any questions about your discharge medications or the care you received while you were in the hospital after you are discharged, you can call the unit and asked to speak with the hospitalist on call if the hospitalist that took care of you is not available. Once you are discharged, your primary care physician will handle any further medical issues. Please note that NO REFILLS for any discharge medications will be authorized once you are discharged, as it is imperative that you return to your primary care physician (or establish a relationship with a primary care physician if you do not have one) for your aftercare needs so that they can reassess your need for medications and monitor your lab values.    Today   CHIEF COMPLAINT:   Chief Complaint  Patient presents with  . Shoulder Pain    right  . Near Syncope    HISTORY OF PRESENT ILLNESS:  Denise Curry  is a 40 y.o. female with a known history of neurofibromatosis presents to the emergency department complaining of right shoulder pain. The patient states that her right shoulder began to hurt 4 days ago. The pain in her right shoulder extends into her chest and radiates  down her right arm. The patient states that her pain is 10 out of 10 in severity and associated at times with nausea, vomiting and dizziness. Her pain has waxed and waned but never gone away for 5 days now. She states that moving her right arm makes her pain worse but nothing makes it better. The pain is present with normal activity or with rest area the patient denies any strenuous activity lately. She admits to occasional shortness of breath with or without  exertion. She reports feeling faint at times when her shortness of breath is greatest. Notably, all of her symptoms began days ago with a right frontal headache that has not gone away. In the emergency department the patient had an ultrasound of her abdomen to rule out cholecystitis given her history of nausea and vomiting. She also had a head CT which showed no acute intracranial abnormality. Troponins have been negative during ED evaluation but her EKG shows ST inversions of her precordial leads. For this reason as well as ongoing chest pain the emergency department staff called the hospitalist service for admission.  VITAL SIGNS:  Blood pressure 116/68, pulse 73, temperature 98.3 F (36.8 C), temperature source Oral, resp. rate 18, height 5' (1.524 m), weight 46.4 kg (102 lb 6.4 oz), SpO2 100 %, not currently breastfeeding.  I/O:   Intake/Output Summary (Last 24 hours) at 12/21/16 1719 Last data filed at 12/21/16 0500  Gross per 24 hour  Intake              240 ml  Output              650 ml  Net             -410 ml    PHYSICAL EXAMINATION:  GENERAL:  41 y.o.-year-old patient lying in the bed with no acute distress.  EYES: Pupils equal, round, reactive to light and accommodation. No scleral icterus. Extraocular muscles intact.  HEENT: Head atraumatic, normocephalic. Oropharynx and nasopharynx clear.  NECK:  Supple, no jugular venous distention. No thyroid enlargement, no tenderness.  LUNGS: Normal breath sounds bilaterally, no wheezing, rales,rhonchi or crepitation. No use of accessory muscles of respiration.  CARDIOVASCULAR: S1, S2 normal. No murmurs, rubs, or gallops.  ABDOMEN: Soft, non-tender, non-distended. Bowel sounds present. No organomegaly or mass.  EXTREMITIES: No pedal edema, cyanosis, or clubbing.  NEUROLOGIC: Cranial nerves II through XII are intact. Muscle strength 5/5 in all extremities. Sensation intact. Gait not checked.  PSYCHIATRIC: The patient is alert and oriented x  3.  SKIN: No obvious rash, lesion, or ulcer.   DATA REVIEW:   CBC  Recent Labs Lab 12/21/16 0459  WBC 6.4  HGB 12.5  HCT 36.5  PLT 288    Chemistries   Recent Labs Lab 12/18/16 2208 12/21/16 0459  NA 141  --   K 3.3*  --   CL 112*  --   CO2 19*  --   GLUCOSE 103*  --   BUN 18  --   CREATININE 0.63 0.47  CALCIUM 9.6  --   AST 21  --   ALT 15  --   ALKPHOS 63  --   BILITOT 0.6  --     Cardiac Enzymes  Recent Labs Lab 12/19/16 1553  TROPONINI <0.03    Microbiology Results  No results found for this or any previous visit.  RADIOLOGY:  Nm Myocar Multi W/spect W/wall Motion / Ef  Result Date: 12/21/2016  Horizontal ST segment depression ST  segment depression of 1 mm was noted during stress in the II, III, aVF, V5 and V6 leads.  The study is normal.  This is a low risk study.  The left ventricular ejection fraction is hyperdynamic (>65%).     EKG:   Orders placed or performed during the hospital encounter of 12/18/16  . EKG 12-Lead  . EKG 12-Lead  . ED EKG  . ED EKG      Management plans discussed with the patient, family and they are in agreement.  CODE STATUS:     Code Status Orders        Start     Ordered   12/19/16 0353  Full code  Continuous     12/19/16 0352    Code Status History    Date Active Date Inactive Code Status Order ID Comments User Context   This patient has a current code status but no historical code status.      TOTAL TIME TAKING CARE OF THIS PATIENT:  minutes.    Altamese Dilling M.D on 12/21/2016 at 5:19 PM  Between 7am to 6pm - Pager - [phone]  After 6pm go to [email or web address] - password [email or web address]  [facility]  Office  [phone]  CC: Primary care physician; Patient, No Pcp Per   Note: This dictation was prepared with Dragon dictation along with smaller phrase technology. Any transcriptional errors that result from this process are unintentional.

## 2016-12-21 NOTE — Progress Notes (Signed)
Patient given discharge teaching and paperwork regarding medications, diet, follow-up appointments and activity. Patient understanding verbalized. No complaints at this time. IV and telemetry discontinued prior to leaving. Skin assessment as previously charted and vitals are stable; on room air. Patient being discharged to home. Prescription handed to patient.  *all discharge instructions discussed with patient via medical interpreter in the room - no questions or complaints. Family/friend coming to pick patient up.

## 2016-12-21 NOTE — Progress Notes (Signed)
Patient was eating her late lunch when Chaplain visited her. Since patient was eating Chaplain did  Not want to disturb her as she was eating.   12/21/16 1400  Clinical Encounter Type  Visited With Patient  Visit Type Initial  Spiritual Encounters  Spiritual Needs Emotional

## 2016-12-21 NOTE — Progress Notes (Signed)
   Patient is for nuclear stress test this morning. Results to follow.  

## 2017-01-14 ENCOUNTER — Emergency Department: Payer: Medicaid Other

## 2017-01-14 ENCOUNTER — Emergency Department
Admission: EM | Admit: 2017-01-14 | Discharge: 2017-01-14 | Disposition: A | Discharge status: Home / Self Care | Payer: Medicaid Other | Attending: Emergency Medicine | Admitting: Emergency Medicine

## 2017-01-14 DIAGNOSIS — R0789 Other chest pain: Secondary | ICD-10-CM | POA: Diagnosis not present

## 2017-01-14 DIAGNOSIS — F419 Anxiety disorder, unspecified: Secondary | ICD-10-CM | POA: Insufficient documentation

## 2017-01-14 DIAGNOSIS — R519 Headache, unspecified: Secondary | ICD-10-CM

## 2017-01-14 DIAGNOSIS — R51 Headache: Secondary | ICD-10-CM | POA: Insufficient documentation

## 2017-01-14 HISTORY — DX: Headache: R51

## 2017-01-14 HISTORY — DX: Other chronic pain: G89.29

## 2017-01-14 LAB — BASIC METABOLIC PANEL
Anion gap: 11 (ref 5–15)
BUN: 21 mg/dL — AB (ref 6–20)
CHLORIDE: 104 mmol/L (ref 101–111)
CO2: 21 mmol/L — AB (ref 22–32)
Calcium: 9.2 mg/dL (ref 8.9–10.3)
Creatinine, Ser: 0.64 mg/dL (ref 0.44–1.00)
GFR calc Af Amer: >60 mL/min (ref >60)
GLUCOSE: 96 mg/dL (ref 65–99)
POTASSIUM: 3.1 mmol/L — AB (ref 3.5–5.1)
Sodium: 136 mmol/L (ref 135–145)

## 2017-01-14 LAB — CBC
HEMATOCRIT: 40.5 % (ref 35.0–47.0)
HEMOGLOBIN: 13.8 g/dL (ref 12.0–16.0)
MCH: 31 pg (ref 26.0–34.0)
MCHC: 34.1 g/dL (ref 32.0–36.0)
MCV: 90.7 fL (ref 80.0–100.0)
Platelets: 324 10*3/uL (ref 150–440)
RBC: 4.46 MIL/uL (ref 3.80–5.20)
RDW: 14.1 % (ref 11.5–14.5)
WBC: 8.4 10*3/uL (ref 3.6–11.0)

## 2017-01-14 LAB — HCG, QUANTITATIVE, PREGNANCY

## 2017-01-14 LAB — TROPONIN I: Troponin I: <0.03 ng/mL (ref <0.03)

## 2017-01-14 MED ORDER — DIPHENHYDRAMINE HCL 50 MG/ML IJ SOLN
50.0000 mg | Freq: Once | INTRAMUSCULAR | Status: AC
  Administered 2017-01-14: 50 mg via INTRAVENOUS
  Filled 2017-01-14: qty 1

## 2017-01-14 MED ORDER — PROCHLORPERAZINE EDISYLATE 5 MG/ML IJ SOLN
10.0000 mg | Freq: Once | INTRAMUSCULAR | Status: AC
Start: 2017-01-14 — End: 2017-01-14
  Administered 2017-01-14: 10 mg via INTRAVENOUS
  Filled 2017-01-14: qty 2

## 2017-01-14 MED ORDER — SODIUM CHLORIDE 0.9 % IV BOLUS (SEPSIS)
1000.0000 mL | Freq: Once | INTRAVENOUS | Status: AC
  Administered 2017-01-14: 1000 mL via INTRAVENOUS

## 2017-01-14 MED ORDER — DEXAMETHASONE SODIUM PHOSPHATE 4 MG/ML IJ SOLN
10.0000 mg | Freq: Once | INTRAMUSCULAR | Status: AC
  Administered 2017-01-14: 10 mg via INTRAVENOUS
  Filled 2017-01-14: qty 3

## 2017-01-14 NOTE — ED Provider Notes (Signed)
[facility] Emergency Department Provider Note  ____________________________________________   First MD Initiated Contact with Patient 01/14/17 1348     (approximate)  I have reviewed the triage vital signs and the nursing notes.   HISTORY  Chief Complaint Headache and Chest Pain    HPI Denise Curry is a 41 y.o. female who comes to the emergency department with multiple complaints. She reports primarily gradual onset not maximal onset bifrontal throbbing headache similar to previous headaches that she has had in the past. She thinks she might have migraine headaches but has never been seen by a neurologist. She also reports some nausea and photophobia. She also reports some sharp right sided and right shoulder chest pain worse when moving her right arm nonexertional. Note she had a recent hospitalization several weeks ago for chest pain and abnormal EKG where she was fully ruled out and found to have a normal stress test. She has not taken any pain medication at home. Nothing seems to make the symptoms better or worse.   Past Medical History:  Diagnosis Date  . Chronic headaches   . Neurofibromatosis [facility])     Patient Active Problem List   Diagnosis Date Noted  . Chest pain 12/19/2016    Past Surgical History:  Procedure Laterality Date  . CESAREAN SECTION      Prior to Admission medications   Medication Sig Start Date End Date Taking? Authorizing Provider  cyclobenzaprine (FLEXERIL) 5 MG tablet Take 1 tablet (5 mg total) by mouth 3 (three) times daily as needed for muscle spasms. 12/21/16   Altamese Dilling, MD    Allergies Patient has no known allergies.  Family History  Problem Relation Age of Onset  . Neurofibromatosis Son     Social History Social History  Substance Use Topics  . Smoking status: Never Smoker  . Smokeless tobacco: Never Used  . Alcohol use No    Review of Systems Constitutional: No  fever/chills Eyes: No visual changes. ENT: No sore throat. Cardiovascular: Positive chest pain. Respiratory: Denies shortness of breath. Gastrointestinal: No abdominal pain.  Positive nausea, no vomiting.  No diarrhea.  No constipation. Genitourinary: Negative for dysuria. Musculoskeletal: Negative for back pain. Skin: Negative for rash. Neurological: Positive for headache   ____________________________________________   PHYSICAL EXAM:  VITAL SIGNS: ED Triage Vitals  Enc Vitals Group     BP 01/14/17 1150 130/89     Pulse Rate 01/14/17 1150 88     Resp 01/14/17 1150 16     Temp 01/14/17 1150 98.2 F (36.8 C)     Temp Source 01/14/17 1150 Oral     SpO2 01/14/17 1150 99 %     Weight 01/14/17 1144 99 lb (44.9 kg)     Height 01/14/17 1144 5' (1.524 m)     Head Circumference --      Peak Flow --      Pain Score 01/14/17 1143 10     Pain Loc --      Pain Edu? --      Excl. in GC? --     Constitutional: Alert and oriented x 4 well appearing nontoxic no diaphoresis speaks in full, clear sentences Eyes: PERRL EOMI. Head: Atraumatic. Nose: No congestion/rhinnorhea. Mouth/Throat: No trismus Neck: No stridor.   Cardiovascular: Normal rate, regular rhythm. Grossly normal heart sounds.  Good peripheral circulation. Respiratory: Normal respiratory effort.  No retractions. Lungs CTAB and moving good air Gastrointestinal: Soft nontender Musculoskeletal: No lower extremity edema  Neurologic:  Normal speech and language. No gross focal neurologic deficits are appreciated. Skin:  Skin is warm, dry and intact. No rash noted. Psychiatric: Appears somewhat anxious    ____________________________________________   DIFFERENTIAL  Acute coronary syndrome, migraine headache, tension headache, cluster headache, cerebral venous thrombosis, anxiety reaction ____________________________________________   LABS (all labs ordered are listed, but only abnormal results are displayed)  Labs  Reviewed  BASIC METABOLIC PANEL - Abnormal; Notable for the following:       Result Value   Potassium 3.1 (*)    CO2 21 (*)    BUN 21 (*)    All other components within normal limits  CBC  TROPONIN I    No signs of acute ischemia and not pregnant __________________________________________  EKG ED ECG REPORT I, Merrily BrittleNeil Brandin Stetzer, the attending physician, personally viewed and interpreted this ECG.  Date: 01/14/2017 Rate: 95 Rhythm: normal sinus rhythm QRS Axis: normal Intervals: normal ST/T Wave abnormalities: normal Conduction Disturbances: none Narrative Interpretation: unremarkable  ____________________________________________  RADIOLOGY  Chest x-ray with no acute disease ____________________________________________   PROCEDURES  Procedure(s) performed: no  Procedures  Critical Care performed: no  Observation: no ____________________________________________   INITIAL IMPRESSION / ASSESSMENT AND PLAN / ED COURSE  Pertinent labs & imaging results that were available during my care of the patient were reviewed by me and considered in my medical decision making (see chart for details).  The patient arrives hemodynamically stable and well-appearing with a normal neurological exam. Her primary concern is her headache and nausea. She does have atypical chest pain, but she had a complete rule out recently. Regarding her headache she was given 10 mg of prochlorperazine along with diphenhydramine and fluid which completely resolved her pain and her symptoms. At this point this is either a migraine headache or tension headache likely exacerbated by anxiety. Given her 10 mg dexamethasone to help prevent recurrence and I will refer her back to her primary care physician. She is discharged home in improved and good condition.      ____________________________________________   FINAL CLINICAL IMPRESSION(S) / ED DIAGNOSES  Final diagnoses:  None      NEW MEDICATIONS  STARTED DURING THIS VISIT:  New Prescriptions   No medications on file     Note:  This document was prepared using Dragon voice recognition software and may include unintentional dictation errors.     Merrily Brittleifenbark, Jeannemarie Sawaya, MD 01/14/17 1540

## 2017-01-14 NOTE — ED Triage Notes (Signed)
Pt reports that she started one week ago with chest pain on the right side of her chest that radiates down her right arm - she reports that she started today with headache and right arm pain to the point of severity that she felt she needed to be checked again - pt reports N/V and that she is unable to keep anything down - pt c/o lower back pain but denies abd pain

## 2017-01-14 NOTE — Discharge Instructions (Signed)
Please follow-up with your primary care physician as needed for recheck. Return to the emergency department sooner for any concerns.  It was a pleasure to take care of you today, and thank you for coming to our emergency department.  If you have any questions or concerns before leaving please ask the nurse to grab me and I'm more than happy to go through your aftercare instructions again.  If you were prescribed any opioid pain medication today such as Norco, Vicodin, Percocet, morphine, hydrocodone, or oxycodone please make sure you do not drive when you are taking this medication as it can alter your ability to drive safely.  If you have any concerns once you are home that you are not improving or are in fact getting worse before you can make it to your follow-up appointment, please do not hesitate to call 911 and come back for further evaluation.  Merrily BrittleNeil Aijalon Demuro MD  Results for orders placed or performed during the hospital encounter of 01/14/17  Basic metabolic panel  Result Value Ref Range   Sodium 136 135 - 145 mmol/L   Potassium 3.1 (L) 3.5 - 5.1 mmol/L   Chloride 104 101 - 111 mmol/L   CO2 21 (L) 22 - 32 mmol/L   Glucose, Bld 96 65 - 99 mg/dL   BUN 21 (H) 6 - 20 mg/dL   Creatinine, Ser 1.610.64 0.44 - 1.00 mg/dL   Calcium 9.2 8.9 - 09.610.3 mg/dL   GFR calc non Af Amer >60 >60 mL/min   GFR calc Af Amer >60 >60 mL/min   Anion gap 11 5 - 15  CBC  Result Value Ref Range   WBC 8.4 3.6 - 11.0 K/uL   RBC 4.46 3.80 - 5.20 MIL/uL   Hemoglobin 13.8 12.0 - 16.0 g/dL   HCT 04.540.5 40.935.0 - 81.147.0 %   MCV 90.7 80.0 - 100.0 fL   MCH 31.0 26.0 - 34.0 pg   MCHC 34.1 32.0 - 36.0 g/dL   RDW 91.414.1 78.211.5 - 95.614.5 %   Platelets 324 150 - 440 K/uL  Troponin I  Result Value Ref Range   Troponin I <0.03 <0.03 ng/mL   Dg Chest 2 View  Result Date: 01/14/2017 CLINICAL DATA:  Chest pain. EXAM: CHEST  2 VIEW COMPARISON:  Radiographs of Dec 18, 2016. FINDINGS: The heart size and mediastinal contours are within  normal limits. Both lungs are clear. No pneumothorax or pleural effusion is noted. The visualized skeletal structures are unremarkable. IMPRESSION: No active cardiopulmonary disease. Electronically Signed   By: Lupita RaiderJames  Green Jr, M.D.   On: 01/14/2017 13:11   Dg Chest 2 View  Result Date: 12/19/2016 CLINICAL DATA:  Dizziness and RIGHT chest pain. Pain radiates to RIGHT arm for 1 day. History of neurofibromatosis. EXAM: CHEST  2 VIEW COMPARISON:  None. FINDINGS: Cardiomediastinal silhouette is normal. No pleural effusions or focal consolidations. Trachea projects midline and there is no pneumothorax. Soft tissue planes and included osseous structures are non-suspicious. IMPRESSION: Normal chest. Electronically Signed   By: Awilda Metroourtnay  Bloomer M.D.   On: 12/19/2016 00:21   Ct Head Wo Contrast  Result Date: 12/18/2016 CLINICAL DATA:  Headache dizziness near syncope EXAM: CT HEAD WITHOUT CONTRAST TECHNIQUE: Contiguous axial images were obtained from the base of the skull through the vertex without intravenous contrast. COMPARISON:  None. FINDINGS: Brain: No evidence of acute infarction, hemorrhage, hydrocephalus, extra-axial collection or mass lesion/mass effect. Vascular: No hyperdense vessel or unexpected calcification. Skull: Normal. Negative for fracture or focal lesion.  Sinuses/Orbits: No acute finding. Other: None IMPRESSION: No CT evidence for acute intracranial abnormality. Electronically Signed   By: Jasmine Pang M.D.   On: 12/18/2016 23:15   Nm Myocar Multi W/spect W/wall Motion / Ef  Result Date: 12/21/2016  Horizontal ST segment depression ST segment depression of 1 mm was noted during stress in the II, III, aVF, V5 and V6 leads.  The study is normal.  This is a low risk study.  The left ventricular ejection fraction is hyperdynamic (>65%).    US Abdomen Limited Ruq  Result Date: 12/19/2016 CLINICAL DATA:  Right shoulder pain with nausea vomiting and dizziness EXAM: US ABDOMEN LIMITED - RIGHT  UPPER QUADRANT COMPARISON:  None. FINDINGS: Gallbladder: No gallstones or wall thickening visualized. No sonographic Murphy sign noted by sonographer. Common bile duct: Diameter: Borderline enlarged at 6.7 mm Liver: No focal lesion identified. Within normal limits in parenchymal echogenicity. IMPRESSION: 1. Negative for gallstones or cholecystitis 2. Borderline enlarged common bile duct, suggest correlation with laboratory values. Electronically Signed   By: Jasmine Pang M.D.   On: 12/19/2016 01:56

## 2017-04-15 ENCOUNTER — Encounter: Payer: Self-pay | Admitting: Emergency Medicine

## 2017-04-15 ENCOUNTER — Emergency Department
Admission: EM | Admit: 2017-04-15 | Discharge: 2017-04-15 | Disposition: A | Discharge status: Home / Self Care | Payer: Medicaid Other | Attending: Emergency Medicine | Admitting: Emergency Medicine

## 2017-04-15 DIAGNOSIS — Z77098 Contact with and (suspected) exposure to other hazardous, chiefly nonmedicinal, chemicals: Secondary | ICD-10-CM | POA: Diagnosis not present

## 2017-04-15 DIAGNOSIS — R51 Headache: Secondary | ICD-10-CM | POA: Diagnosis present

## 2017-04-15 DIAGNOSIS — Z79899 Other long term (current) drug therapy: Secondary | ICD-10-CM | POA: Insufficient documentation

## 2017-04-15 DIAGNOSIS — G43009 Migraine without aura, not intractable, without status migrainosus: Secondary | ICD-10-CM | POA: Diagnosis not present

## 2017-04-15 MED ORDER — KETOROLAC TROMETHAMINE 30 MG/ML IJ SOLN
30.0000 mg | Freq: Once | INTRAMUSCULAR | Status: AC
  Administered 2017-04-15: 30 mg via INTRAVENOUS
  Filled 2017-04-15: qty 1

## 2017-04-15 MED ORDER — KETOROLAC TROMETHAMINE 10 MG PO TABS: 10.0000 mg | ORAL_TABLET | Freq: Four times a day (QID) | ORAL | 0 refills | Status: AC | PRN

## 2017-04-15 MED ORDER — TRAMADOL HCL 50 MG PO TABS
50.0000 mg | ORAL_TABLET | Freq: Once | ORAL | Status: AC
  Administered 2017-04-15: 50 mg via ORAL
  Filled 2017-04-15: qty 1

## 2017-04-15 MED ORDER — PREDNISONE 10 MG PO TABS: 50.0000 mg | ORAL_TABLET | Freq: Every day | ORAL | 0 refills | Status: AC

## 2017-04-15 NOTE — ED Notes (Signed)
See triage note  Developed sore throat and headache after using bleach  Throat scratchy

## 2017-04-15 NOTE — ED Triage Notes (Addendum)
Pt reports yesterday cleaned bathroom with bleach and since then has had sore throat, headache and generalized body aches. Pt reports feeling that her throat is scratchy. Speaking in complete sentences without difficulty. Room air SpO2 100%. Pt reports history of migraine headaches and feels the beginning of one.

## 2017-04-16 NOTE — ED Provider Notes (Signed)
[facility] Emergency Department Provider Note  ____________________________________________   09:08 AM   (approximate)  I have reviewed the triage vital signs and the nursing notes.   HISTORY  Chief Complaint Headache and Exposure to bleach   HPI Denise Curry is a 41 y.o. female who presents to the emergency department for treatment and evaluation after inhaling bleach fumes yesterday while cleaning her bathroom.She states that since that time she's had a sore throat and some shortness of breath and that her throat feels scratchy. She also states that this is triggered a migraine and she has not been able to get the migraine to break with her Relpax or ibuprofen.   Past Medical History:  Diagnosis Date  . Chronic headaches   . 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 Chest pain 12/19/2016    Past Surgical History:  Procedure Laterality Date  . CESAREAN SECTION      Prior to Admission medications   Medication Sig Start Date End Date Taking? Authorizing Provider  eletriptan (RELPAX) 20 MG tablet Take 20 mg by mouth as needed for migraine or headache. May repeat in 2 hours if headache persists or recurs.   Yes [provider]  cyclobenzaprine (FLEXERIL) 5 MG tablet Take 1 tablet (5 mg total) by mouth 3 (three) times daily as needed for muscle spasms. Patient not taking: Reported on 01/14/2017 12/21/16   Altamese Dilling, MD  ketorolac (TORADOL) 10 MG tablet Take 1 tablet (10 mg total) by mouth every 6 (six) hours as needed. 04/15/17   Dalyla Chui B, FNP  predniSONE (DELTASONE) 10 MG tablet Take 5 tablets (50 mg total) by mouth daily. 04/15/17   Chinita Pester, FNP    Allergies Patient has no known allergies.  Family History  Problem Relation Age of Onset  . Neurofibromatosis Son     Social History Social History  Substance Use Topics  . Smoking status: Never Smoker  .  Smokeless tobacco: Never Used  . Alcohol use No    Review of Systems  Constitutional: No fever/chills Eyes: No visual changes. ENT: Positive for sore throat. Cardiovascular: Denies chest pain. Respiratory: Positive for shortness of breath. Gastrointestinal: No abdominal pain.  No nausea, no vomiting. Musculoskeletal: Negative for back pain. Skin: Negative for rash. Neurological: Positive for headaches, focal weakness or numbness. ____________________________________________   PHYSICAL EXAM:  VITAL SIGNS: ED Triage Vitals  Enc Vitals Group     BP 04/15/17 0856 (!) 133/96     Pulse Rate 04/15/17 0856 79     Resp 04/15/17 0856 18     Temp 04/15/17 0856 97.9 F (36.6 C)     Temp Source 04/15/17 0856 Oral     SpO2 04/15/17 0856 100 %     Weight 04/15/17 0856 117 lb (53.1 kg)     Height 04/15/17 0856 4\' 10"  (1.473 m)     Head Circumference --      Peak Flow --      Pain Score 04/15/17 0855 10     Pain Loc --      Pain Edu? --      Excl. in GC? --     Constitutional: Alert and oriented.Uncomfortable appearing and in no acute distress. Eyes: Conjunctivae are normal. Head: Atraumatic. Nose: No congestion/rhinnorhea. Mouth/Throat: Mucous membranes are moist.  Oropharynx non-erythematous. Neck: No stridor.   Cardiovascular: Normal rate, regular rhythm. Grossly normal heart sounds.  Good  peripheral circulation. Respiratory: Normal respiratory effort.  No retractions. Lungs CTAB. Gastrointestinal: Soft and nontender. No distention. No abdominal bruits. No CVA tenderness. Musculoskeletal: No lower extremity tenderness nor edema.  No joint effusions. Neurologic:  Normal speech and language. No gross focal neurologic deficits are appreciated. No gait instability. Skin:  Skin is warm, dry and intact. No rash noted. Psychiatric: Mood and affect are normal. Speech and behavior are normal.  ____________________________________________   LABS (all labs ordered are listed, but only  abnormal results are displayed)  Labs Reviewed - No data to display ____________________________________________  EKG  Not indicated. ____________________________________________  RADIOLOGY  No results found.  ____________________________________________   PROCEDURES  Procedure(s) performed: None  Procedures  Critical Care performed: No  ____________________________________________   INITIAL IMPRESSION / ASSESSMENT AND PLAN / ED COURSE  Pertinent labs & imaging results that were available during my care of the patient were reviewed by me and considered in my medical decision making (see chart for details).  41 year old female presenting to the emergency department for evaluation and treatment after inhaling bleach fumes yesterday. While in the emergency department, she was given some tramadol with partial relief of her headache. She'll be treated with a burst dose of prednisone and advised to either follow up with her primary care provider or return to the emergency department for symptoms that don't seem to be improving over the next couple days. She was advised to return to the emergency department immediately for symptoms that change or worsen. ____________________________________________   FINAL CLINICAL IMPRESSION(S) / ED DIAGNOSES  Final diagnoses:  Migraine without aura and without status migrainosus, not intractable  Chemical exposure    NEW MEDICATIONS STARTED DURING THIS VISIT:  Discharge Medication List as of 04/15/2017  9:53 AM    START taking these medications   Details  ketorolac (TORADOL) 10 MG tablet Take 1 tablet (10 mg total) by mouth every 6 (six) hours as needed., Starting Thu 04/15/2017, Print    predniSONE (DELTASONE) 10 MG tablet Take 5 tablets (50 mg total) by mouth daily., Starting Thu 04/15/2017, Print         Note:  This document was prepared using Dragon voice recognition software and may include unintentional dictation errors.      Chinita Pesterriplett, Derrel Moore B, FNP 04/16/17 [phone]    Myrna BlazerSchaevitz, David Matthew, MD 04/20/17 [phone]

## 2017-05-28 ENCOUNTER — Emergency Department
Admission: EM | Admit: 2017-05-28 | Discharge: 2017-05-29 | Disposition: A | Discharge status: Home / Self Care | Payer: Medicaid Other | Attending: Emergency Medicine | Admitting: Emergency Medicine

## 2017-05-28 ENCOUNTER — Encounter: Payer: Self-pay | Admitting: Emergency Medicine

## 2017-05-28 DIAGNOSIS — Z79899 Other long term (current) drug therapy: Secondary | ICD-10-CM | POA: Insufficient documentation

## 2017-05-28 DIAGNOSIS — R112 Nausea with vomiting, unspecified: Secondary | ICD-10-CM | POA: Diagnosis present

## 2017-05-28 DIAGNOSIS — N3000 Acute cystitis without hematuria: Secondary | ICD-10-CM | POA: Diagnosis not present

## 2017-05-28 DIAGNOSIS — K529 Noninfective gastroenteritis and colitis, unspecified: Secondary | ICD-10-CM

## 2017-05-28 LAB — COMPREHENSIVE METABOLIC PANEL
ALBUMIN: 5.3 g/dL — AB (ref 3.5–5.0)
ALT: 17 U/L (ref 14–54)
AST: 18 U/L (ref 15–41)
Alkaline Phosphatase: 61 U/L (ref 38–126)
Anion gap: 11 (ref 5–15)
BUN: 27 mg/dL — AB (ref 6–20)
CHLORIDE: 103 mmol/L (ref 101–111)
CO2: 23 mmol/L (ref 22–32)
CREATININE: 0.46 mg/dL (ref 0.44–1.00)
Calcium: 9.9 mg/dL (ref 8.9–10.3)
GFR calc Af Amer: >60 mL/min (ref >60)
GFR calc non Af Amer: >60 mL/min (ref >60)
GLUCOSE: 95 mg/dL (ref 65–99)
Potassium: 4.2 mmol/L (ref 3.5–5.1)
Sodium: 137 mmol/L (ref 135–145)
Total Bilirubin: 0.9 mg/dL (ref 0.3–1.2)
Total Protein: 9.1 g/dL — ABNORMAL HIGH (ref 6.5–8.1)

## 2017-05-28 LAB — CBC
HCT: 44.5 % (ref 35.0–47.0)
Hemoglobin: 14.7 g/dL (ref 12.0–16.0)
MCH: 30.3 pg (ref 26.0–34.0)
MCHC: 33.1 g/dL (ref 32.0–36.0)
MCV: 91.4 fL (ref 80.0–100.0)
PLATELETS: 384 10*3/uL (ref 150–440)
RBC: 4.87 MIL/uL (ref 3.80–5.20)
RDW: 13.1 % (ref 11.5–14.5)
WBC: 12.4 10*3/uL — AB (ref 3.6–11.0)

## 2017-05-28 LAB — LIPASE, BLOOD: LIPASE: 23 U/L (ref 11–51)

## 2017-05-28 MED ORDER — ONDANSETRON 4 MG PO TBDP
4.0000 mg | ORAL_TABLET | Freq: Once | ORAL | Status: AC | PRN
  Administered 2017-05-28: 4 mg via ORAL
  Filled 2017-05-28: qty 1

## 2017-05-28 NOTE — ED Triage Notes (Signed)
Patient arrives from home with co vomiting for the last 2 days.  She states she is unable to keep anything down including water.

## 2017-05-29 ENCOUNTER — Emergency Department: Payer: Medicaid Other

## 2017-05-29 LAB — URINALYSIS, COMPLETE (UACMP) WITH MICROSCOPIC
Bilirubin Urine: NEGATIVE
GLUCOSE, UA: NEGATIVE mg/dL
KETONES UR: 80 mg/dL — AB
Nitrite: NEGATIVE
PROTEIN: 30 mg/dL — AB
Specific Gravity, Urine: 1.028 (ref 1.005–1.030)
pH: 6 (ref 5.0–8.0)

## 2017-05-29 LAB — POCT PREGNANCY, URINE: PREG TEST UR: NEGATIVE

## 2017-05-29 MED ORDER — MORPHINE SULFATE (PF) 4 MG/ML IV SOLN
INTRAVENOUS | Status: AC
  Administered 2017-05-29: 4 mg
  Filled 2017-05-29: qty 1

## 2017-05-29 MED ORDER — ONDANSETRON HCL 4 MG/2ML IJ SOLN
INTRAMUSCULAR | Status: AC
  Administered 2017-05-29: 4 mg
  Filled 2017-05-29: qty 2

## 2017-05-29 MED ORDER — ONDANSETRON 4 MG PO TBDP: 4.0000 mg | ORAL_TABLET | Freq: Three times a day (TID) | ORAL | 0 refills | Status: AC | PRN

## 2017-05-29 MED ORDER — IOPAMIDOL (ISOVUE-300) INJECTION 61%
100.0000 mL | Freq: Once | INTRAVENOUS | Status: AC | PRN
  Administered 2017-05-29: 75 mL via INTRAVENOUS

## 2017-05-29 MED ORDER — CEPHALEXIN 500 MG PO CAPS: 500.0000 mg | ORAL_CAPSULE | Freq: Two times a day (BID) | ORAL | 0 refills | Status: AC

## 2017-05-29 MED ORDER — ACETAMINOPHEN 325 MG PO TABS
ORAL_TABLET | ORAL | Status: AC
  Administered 2017-05-29: 650 mg
  Filled 2017-05-29: qty 2

## 2017-05-29 MED ORDER — DICYCLOMINE HCL 20 MG PO TABS: 20.0000 mg | ORAL_TABLET | Freq: Three times a day (TID) | ORAL | 0 refills | Status: AC | PRN

## 2017-05-29 NOTE — ED Notes (Signed)
Patient is resting comfortably. 

## 2017-05-29 NOTE — ED Notes (Signed)
This RN in with Dr Manson Passey and the interpreter to explain results and give pain meds. Pt verbalizes understanding. No questions.

## 2017-05-29 NOTE — ED Notes (Signed)
Pt c/o headache

## 2017-05-29 NOTE — ED Provider Notes (Signed)
[facility] Emergency Department Provider Note   First MD Initiated Contact with Patient 05/28/17 2351     (approximate)  I have reviewed the triage vital signs and the nursing notes.   HISTORY  Chief Complaint Emesis   HPI Denise Curry is a 41 y.o. female with below list of chronic medical conditions presents to the emergency department with generalized abdominal pain and vomiting 2 days. Patient states that she's been unable to even tolerate drinking water. Patient denies any fever no diarrhea. Patient denies any urinary symptoms.   Past Medical History:  Diagnosis Date  . Chronic headaches   . 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 Chest pain 12/19/2016    Past Surgical History:  Procedure Laterality Date  . CESAREAN SECTION    . OVARY SURGERY      Prior to Admission medications   Medication Sig Start Date End Date Taking? Authorizing Provider  cephALEXin (KEFLEX) 500 MG capsule Take 1 capsule (500 mg total) by mouth 2 (two) times daily. 05/29/17 06/05/17  Darci Current, MD  cyclobenzaprine (FLEXERIL) 5 MG tablet Take 1 tablet (5 mg total) by mouth 3 (three) times daily as needed for muscle spasms. Patient not taking: Reported on 01/14/2017 12/21/16   Altamese Dilling, MD  dicyclomine (BENTYL) 20 MG tablet Take 1 tablet (20 mg total) by mouth 3 (three) times daily as needed for spasms. 05/29/17 05/29/18  Darci Current, MD  eletriptan (RELPAX) 20 MG tablet Take 20 mg by mouth as needed for migraine or headache. May repeat in 2 hours if headache persists or recurs.    [provider]  ketorolac (TORADOL) 10 MG tablet Take 1 tablet (10 mg total) by mouth every 6 (six) hours as needed. 04/15/17   Triplett, Cari B, FNP  ondansetron (ZOFRAN ODT) 4 MG disintegrating tablet Take 1 tablet (4 mg total) by mouth every 8 (eight) hours as needed for nausea or vomiting. 05/29/17    Darci Current, MD  predniSONE (DELTASONE) 10 MG tablet Take 5 tablets (50 mg total) by mouth daily. 04/15/17   Triplett, Rulon Eisenmenger B, FNP    Allergies no known drug allergies  Family History  Problem Relation Age of Onset  . Neurofibromatosis Son     Social History Social History  Substance Use Topics  . Smoking status: Never Smoker  . Smokeless tobacco: Never Used  . Alcohol use No    Review of Systems Constitutional: No fever/chills Eyes: No visual changes. ENT: No sore throat. Cardiovascular: Denies chest pain. Respiratory: Denies shortness of breath. Gastrointestinal: positive for generalized abdominal pain and vomiting Genitourinary: Negative for dysuria. Musculoskeletal: Negative for neck pain.  Negative for back pain. Integumentary: Negative for rash. Neurological: Negative for headaches, focal weakness or numbness.   ____________________________________________   PHYSICAL EXAM:  VITAL SIGNS: ED Triage Vitals [05/28/17 1943]  Enc Vitals Group     BP 136/81     Pulse Rate 92     Resp 14     Temp 98.5 F (36.9 C)     Temp Source Oral     SpO2 99 %     Weight 44.5 kg (98 lb)     Height      Head Circumference      Peak Flow      Pain Score      Pain Loc      Pain Edu?  Excl. in GC?     Constitutional: Alert and oriented. Well appearing and in no acute distress. Eyes: Conjunctivae are normal.  Head: Atraumatic. Mouth/Throat: Mucous membranes are moist. Oropharynx non-erythematous. Neck: No stridor.   Cardiovascular: Normal rate, regular rhythm. Good peripheral circulation. Grossly normal heart sounds. Respiratory: Normal respiratory effort.  No retractions. Lungs CTAB. Gastrointestinal:Generalized tenderness to palpation. No distention.  Musculoskeletal: No lower extremity tenderness nor edema. No gross deformities of extremities. Neurologic:  Normal speech and language. No gross focal neurologic deficits are appreciated.  Skin:  Skin is warm,  dry and intact. No rash noted. Psychiatric: Mood and affect are normal. Speech and behavior are normal.  ____________________________________________   LABS (all labs ordered are listed, but only abnormal results are displayed)  Labs Reviewed  COMPREHENSIVE METABOLIC PANEL - Abnormal; Notable for the following:       Result Value   BUN 27 (*)    Total Protein 9.1 (*)    Albumin 5.3 (*)    All other components within normal limits  CBC - Abnormal; Notable for the following:    WBC 12.4 (*)    All other components within normal limits  URINALYSIS, COMPLETE (UACMP) WITH MICROSCOPIC - Abnormal; Notable for the following:    Color, Urine YELLOW (*)    APPearance HAZY (*)    Hgb urine dipstick MODERATE (*)    Ketones, ur 80 (*)    Protein, ur 30 (*)    Leukocytes, UA TRACE (*)    Bacteria, UA RARE (*)    Squamous Epithelial / LPF 6-30 (*)    All other components within normal limits  LIPASE, BLOOD  POCT PREGNANCY, URINE     RADIOLOGY I, Marblemount N BROWN, personally viewed and evaluated these images (plain radiographs) as part of my medical decision making, as well as reviewing the written report by the radiologist.  Ct Abdomen Pelvis W Contrast  Result Date: 05/29/2017 CLINICAL DATA:  Acute onset of vomiting.  Initial encounter. EXAM: CT ABDOMEN AND PELVIS WITH CONTRAST TECHNIQUE: Multidetector CT imaging of the abdomen and pelvis was performed using the standard protocol following bolus administration of intravenous contrast. CONTRAST:  75mL ISOVUE-300 IOPAMIDOL (ISOVUE-300) INJECTION 61% COMPARISON:  Right upper quadrant ultrasound performed 12/19/2016 FINDINGS: Lower chest: The visualized lung bases are grossly clear. The visualized portions of the mediastinum are unremarkable. Hepatobiliary: The liver is unremarkable in appearance. The gallbladder is unremarkable in appearance. The common bile duct remains normal in caliber. Pancreas: The pancreas is within normal limits. Spleen:  The spleen is unremarkable in appearance. Adrenals/Urinary Tract: The adrenal glands are unremarkable in appearance. Mild left renal scarring is noted. There is no evidence of hydronephrosis. No renal or ureteral stones are identified. No perinephric stranding is seen. Stomach/Bowel: The stomach is unremarkable in appearance. The small bowel is within normal limits. The appendix is normal in caliber, without evidence of appendicitis. The colon is unremarkable in appearance. Vascular/Lymphatic: The abdominal aorta is unremarkable in appearance. The inferior vena cava is grossly unremarkable. No retroperitoneal lymphadenopathy is seen. No pelvic sidewall lymphadenopathy is identified. Reproductive: The bladder is mildly distended and grossly unremarkable. The uterus is unremarkable in appearance. The intrauterine device is noted in expected position at the fundus of the uterus. The ovaries are relatively symmetric. No suspicious adnexal masses are seen. Other: No additional soft tissue abnormalities are seen. Musculoskeletal: No acute osseous abnormalities are identified. The visualized musculature is unremarkable in appearance. IMPRESSION: 1. No acute abnormality seen within the abdomen or  pelvis. 2. Mild left renal scarring. Electronically Signed   By: Roanna Raider M.D.   On: 05/29/2017 03:41      Procedures   ____________________________________________   INITIAL IMPRESSION / ASSESSMENT AND PLAN / ED COURSE  As part of my medical decision making, I reviewed the following data within the electronic MEDICAL RECORD NUMBER 41 year old female presenting with above stated history and physical exam of vomiting and generalized abdominal discomfort laboratory data revealed mild leukocytosis of 12.4 urinalysis revealed rare bacteria and trace leukocytes. Given generalized pain mild leukocytosis and vomiting CT scan of the abdomen was performed which revealed no acute intra-abdominal  pathology ____________________________________________  FINAL CLINICAL IMPRESSION(S) / ED DIAGNOSES  Final diagnoses:  Gastroenteritis  Acute cystitis without hematuria     MEDICATIONS GIVEN DURING THIS VISIT:  Medications  acetaminophen (TYLENOL) 325 MG tablet (not administered)  ondansetron (ZOFRAN-ODT) disintegrating tablet 4 mg (4 mg Oral Given 05/28/17 2006)  ondansetron (ZOFRAN) 4 MG/2ML injection (4 mg  Given 05/29/17 0042)  morphine 4 MG/ML injection (4 mg  Given 05/29/17 0042)  morphine 4 MG/ML injection (4 mg  Given 05/29/17 0247)  iopamidol (ISOVUE-300) 61 % injection 100 mL (75 mLs Intravenous Contrast Given 05/29/17 0320)     NEW OUTPATIENT MEDICATIONS STARTED DURING THIS VISIT:  New Prescriptions   CEPHALEXIN (KEFLEX) 500 MG CAPSULE    Take 1 capsule (500 mg total) by mouth 2 (two) times daily.   DICYCLOMINE (BENTYL) 20 MG TABLET    Take 1 tablet (20 mg total) by mouth 3 (three) times daily as needed for spasms.   ONDANSETRON (ZOFRAN ODT) 4 MG DISINTEGRATING TABLET    Take 1 tablet (4 mg total) by mouth every 8 (eight) hours as needed for nausea or vomiting.    Modified Medications   No medications on file    Discontinued Medications   No medications on file     Note:  This document was prepared using Dragon voice recognition software and may include unintentional dictation errors.    Darci Current, MD 05/29/17 0700

## 2017-05-29 NOTE — ED Notes (Signed)
This RN and doctor Manson Passey in to speak with pt. Interpreter Clydie Braun [phone] in use. Pt reports pain to the left flank, pain with urination, nausea, vomiting, small amounts of blood in utire. No history of kidney stone. Pain in the right and left lower quads with palpation.

## 2017-06-11 ENCOUNTER — Other Ambulatory Visit: Payer: Self-pay | Admitting: Neurology

## 2017-06-11 DIAGNOSIS — G43019 Migraine without aura, intractable, without status migrainosus: Secondary | ICD-10-CM

## 2017-06-11 DIAGNOSIS — Q8501 Neurofibromatosis, type 1: Secondary | ICD-10-CM

## 2017-06-11 DIAGNOSIS — H538 Other visual disturbances: Secondary | ICD-10-CM

## 2017-06-17 ENCOUNTER — Ambulatory Visit
Admission: RE | Admit: 2017-06-17 | Discharge: 2017-06-17 | Disposition: A | Discharge status: Home / Self Care | Payer: Medicaid Other | Source: Ambulatory Visit | Attending: Neurology | Admitting: Neurology

## 2017-06-17 DIAGNOSIS — G43019 Migraine without aura, intractable, without status migrainosus: Secondary | ICD-10-CM

## 2017-06-17 DIAGNOSIS — Q8501 Neurofibromatosis, type 1: Secondary | ICD-10-CM | POA: Insufficient documentation

## 2017-06-17 DIAGNOSIS — L989 Disorder of the skin and subcutaneous tissue, unspecified: Secondary | ICD-10-CM | POA: Insufficient documentation

## 2017-06-17 DIAGNOSIS — H538 Other visual disturbances: Secondary | ICD-10-CM | POA: Insufficient documentation

## 2017-06-17 MED ORDER — GADOBENATE DIMEGLUMINE 529 MG/ML IV SOLN
9.0000 mL | Freq: Once | INTRAVENOUS | Status: AC | PRN
  Administered 2017-06-17: 9 mL via INTRAVENOUS

## 2017-08-15 ENCOUNTER — Emergency Department
Admission: EM | Admit: 2017-08-15 | Discharge: 2017-08-15 | Disposition: A | Discharge status: Home / Self Care | Payer: Medicaid Other | Attending: Emergency Medicine | Admitting: Emergency Medicine

## 2017-08-15 ENCOUNTER — Other Ambulatory Visit: Payer: Self-pay

## 2017-08-15 ENCOUNTER — Encounter: Payer: Self-pay | Admitting: Emergency Medicine

## 2017-08-15 DIAGNOSIS — Z79899 Other long term (current) drug therapy: Secondary | ICD-10-CM | POA: Insufficient documentation

## 2017-08-15 DIAGNOSIS — R51 Headache: Secondary | ICD-10-CM | POA: Insufficient documentation

## 2017-08-15 DIAGNOSIS — R519 Headache, unspecified: Secondary | ICD-10-CM

## 2017-08-15 MED ORDER — METOCLOPRAMIDE HCL 5 MG/ML IJ SOLN
10.0000 mg | Freq: Once | INTRAMUSCULAR | Status: AC
  Administered 2017-08-15: 10 mg via INTRAVENOUS
  Filled 2017-08-15: qty 2

## 2017-08-15 MED ORDER — KETOROLAC TROMETHAMINE 30 MG/ML IJ SOLN
30.0000 mg | Freq: Once | INTRAMUSCULAR | Status: AC
  Administered 2017-08-15: 30 mg via INTRAVENOUS
  Filled 2017-08-15: qty 1

## 2017-08-15 MED ORDER — SODIUM CHLORIDE 0.9 % IV BOLUS (SEPSIS)
1000.0000 mL | Freq: Once | INTRAVENOUS | Status: AC
  Administered 2017-08-15: 1000 mL via INTRAVENOUS

## 2017-08-15 MED ORDER — PROCHLORPERAZINE EDISYLATE 5 MG/ML IJ SOLN
10.0000 mg | Freq: Once | INTRAMUSCULAR | Status: AC
  Administered 2017-08-15: 10 mg via INTRAVENOUS
  Filled 2017-08-15: qty 2

## 2017-08-15 MED ORDER — DIPHENHYDRAMINE HCL 50 MG/ML IJ SOLN
25.0000 mg | Freq: Once | INTRAMUSCULAR | Status: AC
  Administered 2017-08-15: 25 mg via INTRAVENOUS
  Filled 2017-08-15: qty 1

## 2017-08-15 MED ORDER — METOCLOPRAMIDE HCL 10 MG PO TABS: 10.0000 mg | ORAL_TABLET | Freq: Three times a day (TID) | ORAL | 0 refills | Status: AC | PRN

## 2017-08-15 NOTE — ED Triage Notes (Signed)
Pt reports migraine headache, with associated vomiting. Pt reports headache for an entire week but it worsened Thursday. Pt reports photosensitivity. Ambulatory to triage, no apparent distress noted.

## 2017-08-15 NOTE — ED Provider Notes (Signed)
[facility] Emergency Department Provider Note ____________________________________________   First MD Initiated Contact with Patient 08/15/17 1407     (approximate)  I have reviewed the triage vital signs and the nursing notes.   HISTORY  Chief Complaint Headache    HPI Denise Curry is a 41 y.o. female with past medical history of self-reported migraine headache who presents with headache, gradual onset, duration for 1 week but worse in the last 2 days, constant, and bitemporal but worse on the right.  It is associated with photophobia as well as with nausea and intermittent vomiting.  Patient denies fever, abdominal pain, or diarrhea.  Patient reports prior history of multiple similar headaches in the past.  Past Medical History:  Diagnosis Date  . Chronic headaches   . Neurofibromatosis [facility](HCC)     Patient Active Problem List   Diagnosis Date Noted  . Chest pain 12/19/2016    Past Surgical History:  Procedure Laterality Date  . CESAREAN SECTION    . OVARY SURGERY      Prior to Admission medications   Medication Sig Start Date End Date Taking? Authorizing Provider  cyclobenzaprine (FLEXERIL) 5 MG tablet Take 1 tablet (5 mg total) by mouth 3 (three) times daily as needed for muscle spasms. Patient not taking: Reported on 01/14/2017 12/21/16   Altamese DillingVachhani, Vaibhavkumar, MD  dicyclomine (BENTYL) 20 MG tablet Take 1 tablet (20 mg total) by mouth 3 (three) times daily as needed for spasms. 05/29/17 05/29/18  Darci CurrentBrown, Rutland N, MD  eletriptan (RELPAX) 20 MG tablet Take 20 mg by mouth as needed for migraine or headache. May repeat in 2 hours if headache persists or recurs.    [provider]  ketorolac (TORADOL) 10 MG tablet Take 1 tablet (10 mg total) by mouth every 6 (six) hours as needed. 04/15/17   Triplett, Cari B, FNP  ondansetron (ZOFRAN ODT) 4 MG disintegrating tablet Take 1 tablet (4 mg total) by mouth every 8 (eight) hours as  needed for nausea or vomiting. 05/29/17   Darci CurrentBrown, Logan N, MD  predniSONE (DELTASONE) 10 MG tablet Take 5 tablets (50 mg total) by mouth daily. 04/15/17   Chinita Pesterriplett, Cari B, FNP    Allergies Patient has no known allergies.  Family History  Problem Relation Age of Onset  . Neurofibromatosis Son     Social History Social History   Tobacco Use  . Smoking status: Never Smoker  . Smokeless tobacco: Never Used  Substance Use Topics  . Alcohol use: No  . Drug use: No    Review of Systems  Constitutional: No fever. Eyes: Positive for flashes. ENT: No neck pain. Cardiovascular: Denies chest pain. Respiratory: Denies shortness of breath. Gastrointestinal: Positive for nausea and vomiting..  Genitourinary: Negative for flank pain.  Musculoskeletal: Negative for back pain. Skin: Negative for rash. Neurological: Positive for headache.   ____________________________________________   PHYSICAL EXAM:  VITAL SIGNS: ED Triage Vitals  Enc Vitals Group     BP 08/15/17 1255 (!) 125/92     Pulse Rate 08/15/17 1255 91     Resp 08/15/17 1255 18     Temp 08/15/17 1255 98.4 F (36.9 C)     Temp Source 08/15/17 1255 Oral     SpO2 08/15/17 1255 97 %     Weight 08/15/17 1258 98 lb (44.5 kg)     Height --      Head Circumference --      Peak Flow --  Pain Score 08/15/17 1257 10     Pain Loc --      Pain Edu? --      Excl. in GC? --     Constitutional: Alert and oriented. Well appearing and in no acute distress. Eyes: Conjunctivae are normal.  EOMI.  PERRLA.  Mild photophobia. Head: Atraumatic. Nose: No congestion/rhinnorhea. Mouth/Throat: Mucous membranes are moist.   Neck: Normal range of motion.  No meningeal signs. Cardiovascular: Good peripheral circulation. Respiratory: Normal respiratory effort.   Gastrointestinal: No distention.  Musculoskeletal:  Extremities warm and well perfused.  Neurologic:  Normal speech and language. No gross focal neurologic deficits are  appreciated.  Motor and sensory intact in all extremities.  Normal coordination, with no ataxia.  Normal finger to nose.  Cranial nerves III through XII intact. Skin:  Skin is warm and dry. No rash noted. Psychiatric: Mood and affect are normal. Speech and behavior are normal.  ____________________________________________   LABS (all labs ordered are listed, but only abnormal results are displayed)  Labs Reviewed - No data to display ____________________________________________  EKG   ____________________________________________  RADIOLOGY    ____________________________________________   PROCEDURES  Procedure(s) performed: No    Critical Care performed: No ____________________________________________   INITIAL IMPRESSION / ASSESSMENT AND PLAN / ED COURSE  Pertinent labs & imaging results that were available during my care of the patient were reviewed by me and considered in my medical decision making (see chart for details).  41 year old female with past medical history as noted above presents with a headache, similar to headaches she has had multiple times in the past.  Patient states she refers them as migraines, but she states that she has not been diagnosed by a neurologist.  She also reports associated photophobia as well as nausea and vomiting.  Past medical records reviewed in epic; patient has multiple prior ED visits, including on 01/14/2017 for a headache and diagnosed with migraine versus tension headache.  Other past visits her for musculoskeletal symptoms as well as for chest pain, for which patient had admission and ACS rule out May of this year.  On exam, patient is comfortable appearing, the vital signs are normal, and the remainder the exam is as described above with no meningeal signs, and nonfocal neurologic exam.  Presentation is consistent with migraine versus tension headache.  Given history of multiple prior similar headaches and reassuring exam, no  indication for imaging.  Plan for IV fluids, Toradol, Reglan and reassess.    ----------------------------------------- 4:47 PM on 08/15/2017 -----------------------------------------  Headache persisted somewhat after Reglan and Toradol, so I gave Compazine and Benadryl.  The patient reports that the headache is resolved after these additional medications.  She continues to appear comfortable.  She would like to go home.  Return precautions given, the patient expresses understanding.  ____________________________________________   FINAL CLINICAL IMPRESSION(S) / ED DIAGNOSES  Final diagnoses:  Acute nonintractable headache, unspecified headache type      NEW MEDICATIONS STARTED DURING THIS VISIT:  This SmartLink is deprecated. Use AVSMEDLIST instead to display the medication list for a patient.   Note:  This document was prepared using Dragon voice recognition software and may include unintentional dictation errors.    Dionne BucySiadecki, Langston Summerfield, MD 08/15/17 [phone]

## 2017-12-12 ENCOUNTER — Encounter: Payer: Self-pay | Admitting: Emergency Medicine

## 2017-12-12 ENCOUNTER — Other Ambulatory Visit: Payer: Self-pay

## 2017-12-12 ENCOUNTER — Emergency Department
Admission: EM | Admit: 2017-12-12 | Discharge: 2017-12-12 | Disposition: A | Discharge status: Home / Self Care | Payer: Medicaid Other | Attending: Emergency Medicine | Admitting: Emergency Medicine

## 2017-12-12 DIAGNOSIS — M545 Low back pain, unspecified: Secondary | ICD-10-CM

## 2017-12-12 DIAGNOSIS — R300 Dysuria: Secondary | ICD-10-CM | POA: Insufficient documentation

## 2017-12-12 DIAGNOSIS — Z79899 Other long term (current) drug therapy: Secondary | ICD-10-CM | POA: Insufficient documentation

## 2017-12-12 DIAGNOSIS — R110 Nausea: Secondary | ICD-10-CM | POA: Insufficient documentation

## 2017-12-12 LAB — CBC
HEMATOCRIT: 42.2 % (ref 35.0–47.0)
Hemoglobin: 14.6 g/dL (ref 12.0–16.0)
MCH: 31.7 pg (ref 26.0–34.0)
MCHC: 34.6 g/dL (ref 32.0–36.0)
MCV: 91.7 fL (ref 80.0–100.0)
PLATELETS: 315 10*3/uL (ref 150–440)
RBC: 4.6 MIL/uL (ref 3.80–5.20)
RDW: 13.5 % (ref 11.5–14.5)
WBC: 7.9 10*3/uL (ref 3.6–11.0)

## 2017-12-12 LAB — URINALYSIS, COMPLETE (UACMP) WITH MICROSCOPIC
BACTERIA UA: NONE SEEN
Bilirubin Urine: NEGATIVE
Glucose, UA: NEGATIVE mg/dL
Ketones, ur: NEGATIVE mg/dL
Leukocytes, UA: NEGATIVE
Nitrite: NEGATIVE
PROTEIN: NEGATIVE mg/dL
SPECIFIC GRAVITY, URINE: 1.016 (ref 1.005–1.030)
pH: 5 (ref 5.0–8.0)

## 2017-12-12 LAB — BASIC METABOLIC PANEL
Anion gap: 7 (ref 5–15)
BUN: 15 mg/dL (ref 6–20)
CHLORIDE: 107 mmol/L (ref 101–111)
CO2: 22 mmol/L (ref 22–32)
CREATININE: 0.65 mg/dL (ref 0.44–1.00)
Calcium: 8.8 mg/dL — ABNORMAL LOW (ref 8.9–10.3)
Glucose, Bld: 93 mg/dL (ref 65–99)
POTASSIUM: 3.8 mmol/L (ref 3.5–5.1)
SODIUM: 136 mmol/L (ref 135–145)

## 2017-12-12 LAB — POCT PREGNANCY, URINE: Preg Test, Ur: NEGATIVE

## 2017-12-12 MED ORDER — CYCLOBENZAPRINE HCL 5 MG PO TABS: 5.0000 mg | ORAL_TABLET | Freq: Three times a day (TID) | ORAL | 0 refills | Status: AC | PRN

## 2017-12-12 MED ORDER — IBUPROFEN 600 MG PO TABS: 600.0000 mg | ORAL_TABLET | Freq: Four times a day (QID) | ORAL | 0 refills | Status: AC | PRN

## 2017-12-12 NOTE — ED Provider Notes (Signed)
[facility] Emergency Department Provider Note  Time seen: 3:01 PM  I have reviewed the triage vital signs and the nursing notes.   HISTORY  Chief Complaint Flank Pain    HPI Denise Curry is a 42 y.o. female with a past medical history of headaches, presents to the emergency department for bilateral back pain.  According to the patient for the past many months she has been experiencing intermittent back pain across her entire mid to lower back.  Patient states the pain is worse with movement especially twisting or bending movements.  States occasional burning with urination.  Denies any hematuria, vaginal discharge or bleeding.  No fever, states occasional nausea but denies any vomiting.  No diarrhea.  Describes the pain is mild to moderate, aching pain across her entire mid to lower back.   Past Medical History:  Diagnosis Date  . Chronic headaches   . 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 Chest pain 12/19/2016    Past Surgical History:  Procedure Laterality Date  . CESAREAN SECTION    . OVARY SURGERY      Prior to Admission medications   Medication Sig Start Date End Date Taking? Authorizing Provider  cyclobenzaprine (FLEXERIL) 5 MG tablet Take 1 tablet (5 mg total) by mouth 3 (three) times daily as needed for muscle spasms. Patient not taking: Reported on 01/14/2017 12/21/16   Altamese Dilling, MD  dicyclomine (BENTYL) 20 MG tablet Take 1 tablet (20 mg total) by mouth 3 (three) times daily as needed for spasms. 05/29/17 05/29/18  Darci Current, MD  eletriptan (RELPAX) 20 MG tablet Take 20 mg by mouth as needed for migraine or headache. May repeat in 2 hours if headache persists or recurs.    [provider]  ketorolac (TORADOL) 10 MG tablet Take 1 tablet (10 mg total) by mouth every 6 (six) hours as needed. 04/15/17   Triplett, Rulon Eisenmenger B, FNP  metoCLOPramide (REGLAN) 10 MG tablet  Take 1 tablet (10 mg total) by mouth every 8 (eight) hours as needed for up to 3 days for nausea or vomiting (Or headache). 08/15/17 08/18/17  Dionne Bucy, MD  ondansetron (ZOFRAN ODT) 4 MG disintegrating tablet Take 1 tablet (4 mg total) by mouth every 8 (eight) hours as needed for nausea or vomiting. 05/29/17   Darci Current, MD  predniSONE (DELTASONE) 10 MG tablet Take 5 tablets (50 mg total) by mouth daily. 04/15/17   Triplett, Rulon Eisenmenger B, FNP    No Known Allergies  Family History  Problem Relation Age of Onset  . Neurofibromatosis Son     Social History Social History   Tobacco Use  . Smoking status: Never Smoker  . Smokeless tobacco: Never Used  Substance Use Topics  . Alcohol use: No  . Drug use: No    Review of Systems Constitutional: Negative for fever. Eyes: Negative for visual complaints ENT: Negative for recent illness Cardiovascular: Negative for chest pain. Respiratory: Negative for shortness of breath. Gastrointestinal: States mild lower abdominal discomfort.  Positive for nausea but negative for vomiting or diarrhea. Genitourinary: Negative for dysuria, hematuria vaginal bleeding or discharge Musculoskeletal: Back pain worse with movement especially bending or twisting. Skin: Negative for skin complaints  Neurological: Negative for headache All other ROS negative  ____________________________________________   PHYSICAL EXAM:  VITAL SIGNS: ED Triage Vitals  Enc Vitals Group     BP 12/12/17 1345 137/86  Pulse Rate 12/12/17 1345 74     Resp 12/12/17 1345 18     Temp 12/12/17 1345 98.6 F (37 C)     Temp Source 12/12/17 1345 Oral     SpO2 12/12/17 1345 98 %     Weight 12/12/17 1345 98 lb (44.5 kg)     Height 12/12/17 1345  (1.6 m)     Head Circumference --      Peak Flow --      Pain Score 12/12/17 1353 9     Pain Loc --      Pain Edu? --      Excl. in GC? --    Constitutional: Alert and oriented. Well appearing and in no  distress. Eyes: Normal exam ENT   Head: Normocephalic and atraumatic   Mouth/Throat: Mucous membranes are moist. Cardiovascular: Normal rate, regular rhythm. No murmur Respiratory: Normal respiratory effort without tachypnea nor retractions. Breath sounds are clear  Gastrointestinal: Soft, slight suprapubic tenderness.  No rebound or guarding.  No distention.  Abdomen otherwise benign.  No CVA tenderness. Musculoskeletal: Patient does have moderate tenderness over the lumbar paraspinal muscles, no tenderness in the midline. Neurologic:  Normal speech and language. No gross focal neurologic deficits Skin:  Skin is warm, dry and intact.  Psychiatric: Mood and affect are normal. Speech and behavior are normal.   ____________________________________________    INITIAL IMPRESSION / ASSESSMENT AND PLAN / ED COURSE  Pertinent labs & imaging results that were available during my care of the patient were reviewed by me and considered in my medical decision making (see chart for details).  Patient presents to the emergency department for back pain intermittent over the past many months, present constantly for the past 1 week.  Describes as pain across her lower back, affecting both sides.  Made worse with bending or twisting movements.  Differential would include musculoskeletal pain, urinary tract infection, pyelonephritis.  Patient's labs are reassuring including a normal white blood cell count.  Normal-appearing urinalysis besides a small amount of blood, negative pregnancy test.  Patient is quite tender across her paraspinal muscles in the lumbar spine and her description sounds very consistent with musculoskeletal pain.  We will treat with Flexeril and ibuprofen.  I will send a urine culture as a precaution, but highly suspect musculoskeletal pain given the duration of symptoms.  ____________________________________________   FINAL CLINICAL IMPRESSION(S) / ED DIAGNOSES  Back pain     Minna Antis, MD 12/12/17 1506

## 2017-12-12 NOTE — ED Notes (Signed)
Pt c/o low back pain, denies any dysuria sxs, afebrile. Assessment per interpreter.

## 2017-12-12 NOTE — ED Triage Notes (Signed)
Denise Curry, Interpreter present for triage. Pt c/o bilateral flank pain that radiates to her abdomen. Pt c/o some burning with urination, denies any other symptoms. Pt also c/o HA, states hx of migraines at this time. VSS and WNL.

## 2017-12-13 LAB — URINE CULTURE: Culture: <10000 — AB

## 2018-04-01 ENCOUNTER — Emergency Department: Payer: Medicaid Other

## 2018-04-01 ENCOUNTER — Other Ambulatory Visit: Payer: Self-pay

## 2018-04-01 ENCOUNTER — Encounter: Payer: Self-pay | Admitting: Emergency Medicine

## 2018-04-01 ENCOUNTER — Emergency Department
Admission: EM | Admit: 2018-04-01 | Discharge: 2018-04-01 | Disposition: A | Discharge status: Home / Self Care | Payer: Medicaid Other | Attending: Emergency Medicine | Admitting: Emergency Medicine

## 2018-04-01 DIAGNOSIS — R519 Headache, unspecified: Secondary | ICD-10-CM

## 2018-04-01 DIAGNOSIS — R0789 Other chest pain: Secondary | ICD-10-CM | POA: Insufficient documentation

## 2018-04-01 DIAGNOSIS — R51 Headache: Secondary | ICD-10-CM

## 2018-04-01 DIAGNOSIS — Z79899 Other long term (current) drug therapy: Secondary | ICD-10-CM | POA: Insufficient documentation

## 2018-04-01 DIAGNOSIS — G43909 Migraine, unspecified, not intractable, without status migrainosus: Secondary | ICD-10-CM | POA: Insufficient documentation

## 2018-04-01 LAB — BASIC METABOLIC PANEL
Anion gap: 8 (ref 5–15)
BUN: 18 mg/dL (ref 6–20)
CHLORIDE: 110 mmol/L (ref 98–111)
CO2: 21 mmol/L — ABNORMAL LOW (ref 22–32)
CREATININE: 0.55 mg/dL (ref 0.44–1.00)
Calcium: 9.1 mg/dL (ref 8.9–10.3)
GFR calc Af Amer: >60 mL/min (ref >60)
GFR calc non Af Amer: >60 mL/min (ref >60)
Glucose, Bld: 93 mg/dL (ref 70–99)
Potassium: 3.3 mmol/L — ABNORMAL LOW (ref 3.5–5.1)
Sodium: 139 mmol/L (ref 135–145)

## 2018-04-01 LAB — URINALYSIS, ROUTINE W REFLEX MICROSCOPIC
BACTERIA UA: NONE SEEN
BILIRUBIN URINE: NEGATIVE
Glucose, UA: NEGATIVE mg/dL
Ketones, ur: 20 mg/dL — AB
Leukocytes, UA: NEGATIVE
Nitrite: NEGATIVE
Protein, ur: NEGATIVE mg/dL
SPECIFIC GRAVITY, URINE: 1.009 (ref 1.005–1.030)
pH: 6 (ref 5.0–8.0)

## 2018-04-01 LAB — CBC
HEMATOCRIT: 41.3 % (ref 35.0–47.0)
Hemoglobin: 14.4 g/dL (ref 12.0–16.0)
MCH: 31.8 pg (ref 26.0–34.0)
MCHC: 34.8 g/dL (ref 32.0–36.0)
MCV: 91.3 fL (ref 80.0–100.0)
PLATELETS: 330 10*3/uL (ref 150–440)
RBC: 4.52 MIL/uL (ref 3.80–5.20)
RDW: 13.9 % (ref 11.5–14.5)
WBC: 5.7 10*3/uL (ref 3.6–11.0)

## 2018-04-01 LAB — POCT PREGNANCY, URINE: PREG TEST UR: NEGATIVE

## 2018-04-01 LAB — TROPONIN I: Troponin I: <0.03 ng/mL (ref <0.03)

## 2018-04-01 MED ORDER — DIPHENHYDRAMINE HCL 50 MG/ML IJ SOLN
50.0000 mg | Freq: Once | INTRAMUSCULAR | Status: AC
  Administered 2018-04-01: 50 mg via INTRAVENOUS
  Filled 2018-04-01: qty 1

## 2018-04-01 MED ORDER — SODIUM CHLORIDE 0.9 % IV BOLUS
1000.0000 mL | Freq: Once | INTRAVENOUS | Status: AC
  Administered 2018-04-01: 1000 mL via INTRAVENOUS

## 2018-04-01 MED ORDER — METOCLOPRAMIDE HCL 5 MG/ML IJ SOLN
10.0000 mg | Freq: Once | INTRAMUSCULAR | Status: AC
  Administered 2018-04-01: 10 mg via INTRAVENOUS
  Filled 2018-04-01: qty 2

## 2018-04-01 MED ORDER — KETOROLAC TROMETHAMINE 30 MG/ML IJ SOLN
30.0000 mg | Freq: Once | INTRAMUSCULAR | Status: AC
  Administered 2018-04-01: 30 mg via INTRAVENOUS
  Filled 2018-04-01: qty 1

## 2018-04-01 MED ORDER — MORPHINE SULFATE (PF) 4 MG/ML IV SOLN
4.0000 mg | Freq: Once | INTRAVENOUS | Status: AC
  Administered 2018-04-01: 4 mg via INTRAVENOUS
  Filled 2018-04-01: qty 1

## 2018-04-01 NOTE — ED Provider Notes (Signed)
[facility] Emergency Department Provider Note  Time seen: 5:31 PM  I have reviewed the triage vital signs and the nursing notes.   HISTORY  Chief Complaint Chest Pain and Headache    HPI Denise Curry is a 42 y.o. female with a past medical history of chronic headaches, neurofibromatosis, presents to the emergency department for headache and chest pain.  According to the patient for the past 2 weeks she has been experiencing a moderate dull headache with intermittent nausea vomiting neck pain going down her right shoulder with photophobia and phonophobia.  Patient states a long history of migraines, she states they do not typically last this long but she has had migraines last this long on occasion in the past.  Denies any weakness or numbness.  Patient also states pain in her right neck and shoulder and somewhat into her right chest.  States she was seen in [place] last week for the same and got an injection which helped somewhat but she continues to have pain to this area.   Past Medical History:  Diagnosis Date  . Chronic headaches   . Neurofibromatosis [facility](HCC)     Patient Active Problem List   Diagnosis Date Noted  . Chest pain 12/19/2016    Past Surgical History:  Procedure Laterality Date  . CESAREAN SECTION    . OVARY SURGERY      Prior to Admission medications   Medication Sig Start Date End Date Taking? Authorizing Provider  cyclobenzaprine (FLEXERIL) 5 MG tablet Take 1 tablet (5 mg total) by mouth 3 (three) times daily as needed for muscle spasms. 12/12/17   Minna AntisPaduchowski, Dimetrius Montfort, MD  dicyclomine (BENTYL) 20 MG tablet Take 1 tablet (20 mg total) by mouth 3 (three) times daily as needed for spasms. 05/29/17 05/29/18  Darci CurrentBrown, Jellico N, MD  eletriptan (RELPAX) 20 MG tablet Take 20 mg by mouth as needed for migraine or headache. May repeat in 2 hours if headache persists or recurs.    [provider]  ibuprofen (ADVIL,MOTRIN) 600  MG tablet Take 1 tablet (600 mg total) by mouth every 6 (six) hours as needed. 12/12/17   Minna AntisPaduchowski, Ariyonna Twichell, MD  ketorolac (TORADOL) 10 MG tablet Take 1 tablet (10 mg total) by mouth every 6 (six) hours as needed. 04/15/17   Triplett, Rulon Eisenmengerari B, FNP  metoCLOPramide (REGLAN) 10 MG tablet Take 1 tablet (10 mg total) by mouth every 8 (eight) hours as needed for up to 3 days for nausea or vomiting (Or headache). 08/15/17 08/18/17  Dionne BucySiadecki, Sebastian, MD  ondansetron (ZOFRAN ODT) 4 MG disintegrating tablet Take 1 tablet (4 mg total) by mouth every 8 (eight) hours as needed for nausea or vomiting. 05/29/17   Darci CurrentBrown, Brandon N, MD  predniSONE (DELTASONE) 10 MG tablet Take 5 tablets (50 mg total) by mouth daily. 04/15/17   Triplett, Rulon Eisenmengerari B, FNP    No Known Allergies  Family History  Problem Relation Age of Onset  . Neurofibromatosis Son     Social History Social History   Tobacco Use  . Smoking status: Never Smoker  . Smokeless tobacco: Never Used  Substance Use Topics  . Alcohol use: No  . Drug use: No    Review of Systems Constitutional: Negative for fever. Eyes: Negative for visual complaints ENT: Negative for recent illness/congestion Cardiovascular: Right shoulder/chest pain Respiratory: Negative for shortness of breath. Gastrointestinal: Negative for abdominal pain.  Intermittent nausea with one episode of vomiting Genitourinary: Mild dysuria Musculoskeletal: Negative for  leg pain or swelling Skin: Negative for skin complaints  Neurological: Moderate to severe headache ongoing x2 weeks All other ROS negative  ____________________________________________   PHYSICAL EXAM:  VITAL SIGNS: ED Triage Vitals  Enc Vitals Group     BP 04/01/18 1416 (!) 136/102     Pulse Rate 04/01/18 1416 92     Resp 04/01/18 1416 16     Temp 04/01/18 1416 98.4 F (36.9 C)     Temp Source 04/01/18 1416 Oral     SpO2 04/01/18 1416 98 %     Weight 04/01/18 1413 120 lb (54.4 kg)     Height 04/01/18  1413 5\' 4"  (1.626 m)     Head Circumference --      Peak Flow --      Pain Score 04/01/18 1413 10     Pain Loc --      Pain Edu? --      Excl. in GC? --    Constitutional: Alert and oriented. Well appearing and in no distress. Eyes: Normal exam ENT   Head: Normocephalic and atraumatic.   Mouth/Throat: Mucous membranes are moist. Cardiovascular: Normal rate, regular rhythm. No murmur Respiratory: Normal respiratory effort without tachypnea nor retractions. Breath sounds are clear Gastrointestinal: Soft and nontender. No distention.   Musculoskeletal: Nontender with normal range of motion in all extremities. No lower extremity tenderness or edema.  Very reproducible right chest pain. Neurologic:  Normal speech and language. No gross focal neurologic deficits are appreciated. Skin:  Skin is warm, dry and intact.  Psychiatric: Mood and affect are normal. Speech and behavior are normal.   ____________________________________________    EKG  EKG reviewed and interpreted by myself shows normal sinus rhythm 87 bpm with a narrow QRS, normal axis, normal intervals, no concerning ST changes  ____________________________________________    RADIOLOGY  Chest x-ray negative  ____________________________________________   INITIAL IMPRESSION / ASSESSMENT AND PLAN / ED COURSE  Pertinent labs & imaging results that were available during my care of the patient were reviewed by me and considered in my medical decision making (see chart for details).  Patient presents to the emergency department for headache ongoing for 2 weeks as a secondary complaint also states right shoulder/chest pain.  Chest wall is very tender to palpation highly suspect the shoulder and chest pain to be musculoskeletal.  Reassuringly the troponin is negative EKG and chest x-ray reassuring.  Patient does have a history of migraines, headache today consistent with migraine x2 weeks.  Patient otherwise has a normal  physical examination.  No obvious neuro deficits.  We will treat with migraine cocktail of medications and continue to closely monitor.  On review of systems patient did state mild dysuria but cannot quantify for how long.  We will check a urinalysis as a precaution.  Patient states that headache is improved.  We will discharge the patient home with PCP follow-up.  ____________________________________________   FINAL CLINICAL IMPRESSION(S) / ED DIAGNOSES  Chest wall pain Migraine headache     Minna AntisPaduchowski, Liya Strollo, MD 04/01/18 2110

## 2018-04-01 NOTE — ED Notes (Signed)
Instructions reviewed with spanish interpreter amanda.

## 2018-04-01 NOTE — ED Triage Notes (Signed)
Pt to ED from home with c/o headache, hx of migraines. Pt also c/o CP xfew days. PT VSS, language barrier, interpreter used to triage.

## 2019-08-16 IMAGING — CT CT ABD-PELV W/ CM
2 of 5 series · 15 of 46 positions shown, 17 images · IV contrast (APPLIED)
Comparison: Right upper quadrant ultrasound performed 12/19/2016

CLINICAL DATA: Acute onset of vomiting.  Initial encounter.

EXAM:
CT ABDOMEN AND PELVIS WITH CONTRAST
TECHNIQUE: Multidetector CT imaging of the abdomen and pelvis was performed
using the standard protocol following bolus administration of
intravenous contrast.
CONTRAST:  75mL 2ROCI5-6ZZ IOPAMIDOL (2ROCI5-6ZZ) INJECTION 61%

[Series 2: routine abd/pel with · axial · 0.62mm/px · z∈[-752,-377]mm · 12 of 85 slices shown, 14 images]
[im 5/85  soft-tissue]
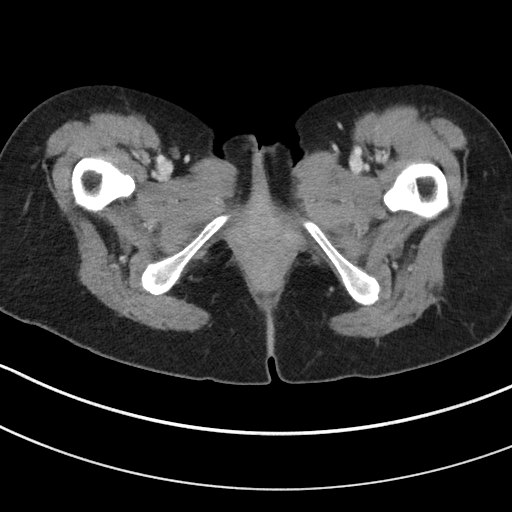
[im 5/85  bone]
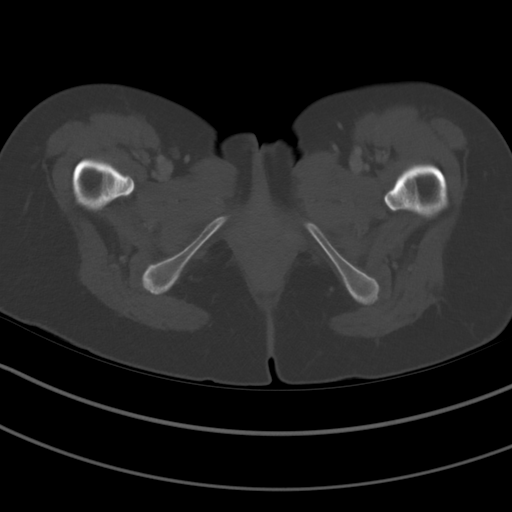
[im 14/85  soft-tissue]
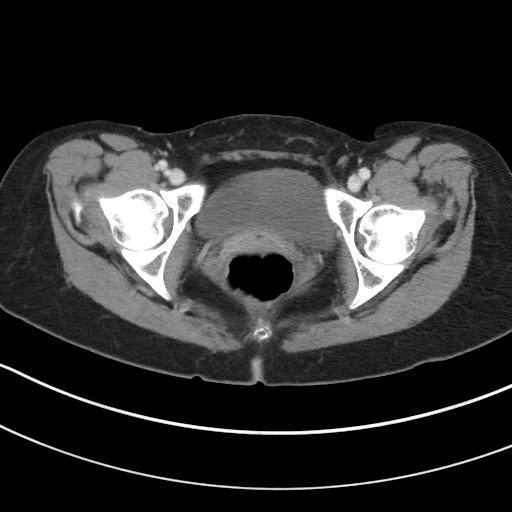
[im 18/85  soft-tissue]
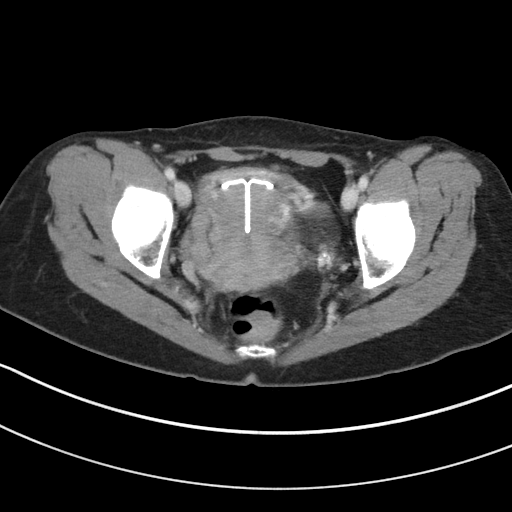
[im 27/85  soft-tissue]
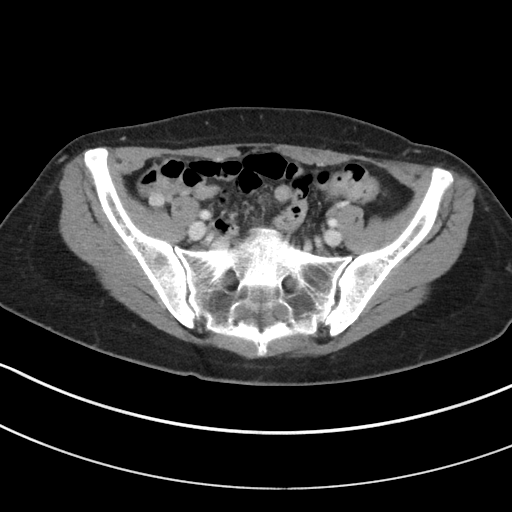
[im 31/85  soft-tissue]
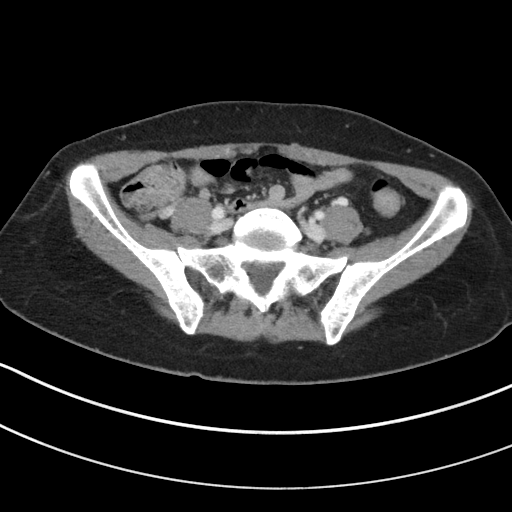
[im 40/85  soft-tissue]
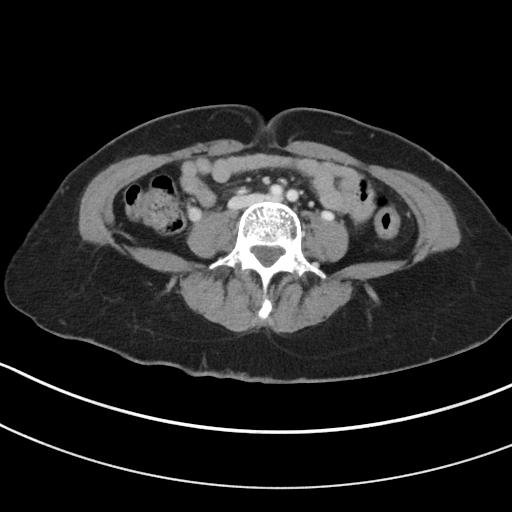
[im 45/85  soft-tissue]
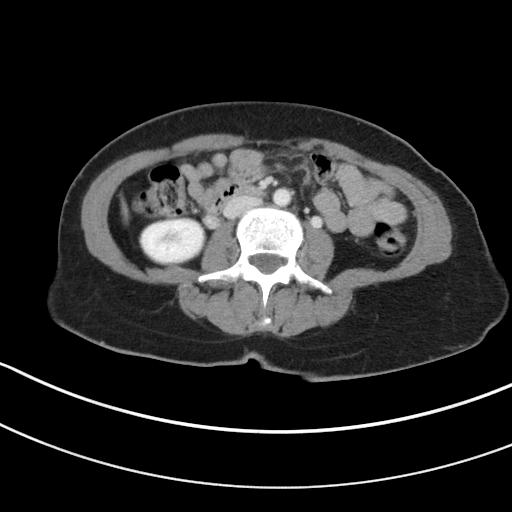
[im 54/85  soft-tissue]
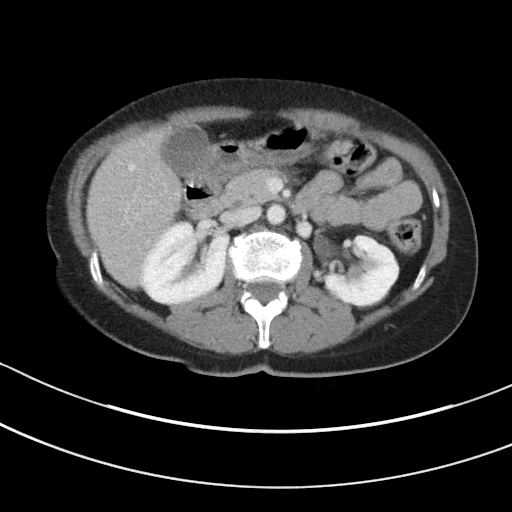
[im 58/85  soft-tissue]
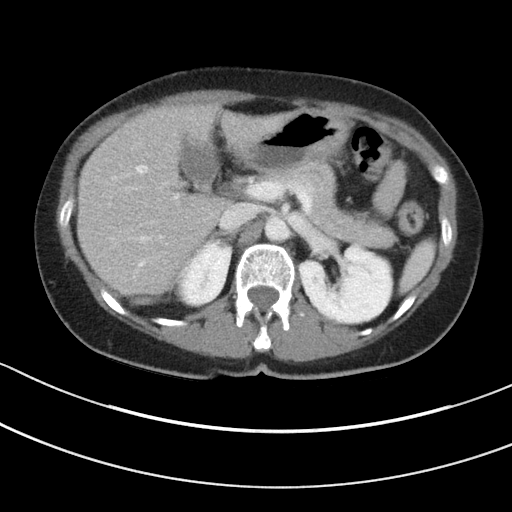
[im 58/85  bone]
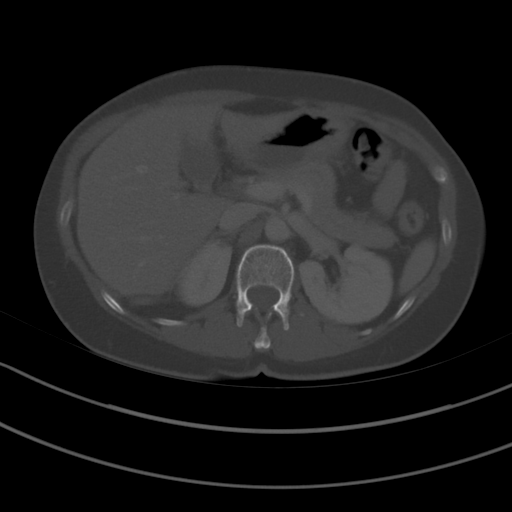
[im 67/85  soft-tissue]
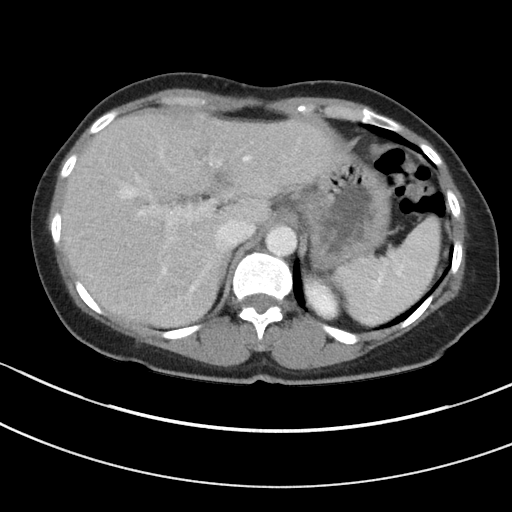
[im 71/85  soft-tissue]
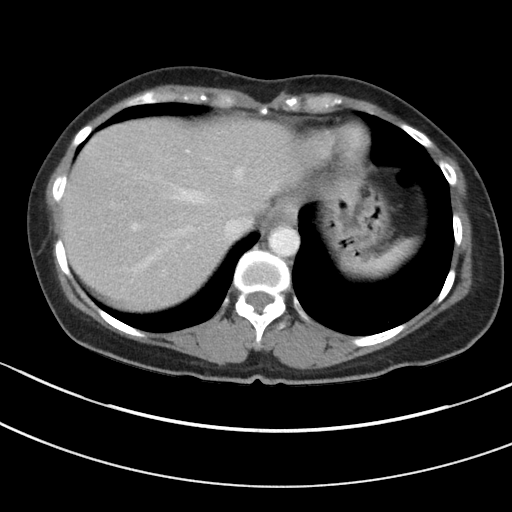
[im 80/85  soft-tissue]
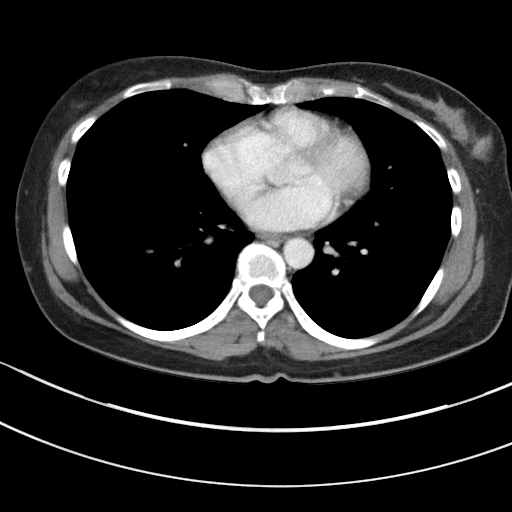

[Series 6: coronal st · coronal · 0.58mm/px · 3 of 60 slices shown]
[im 20/60  soft-tissue]
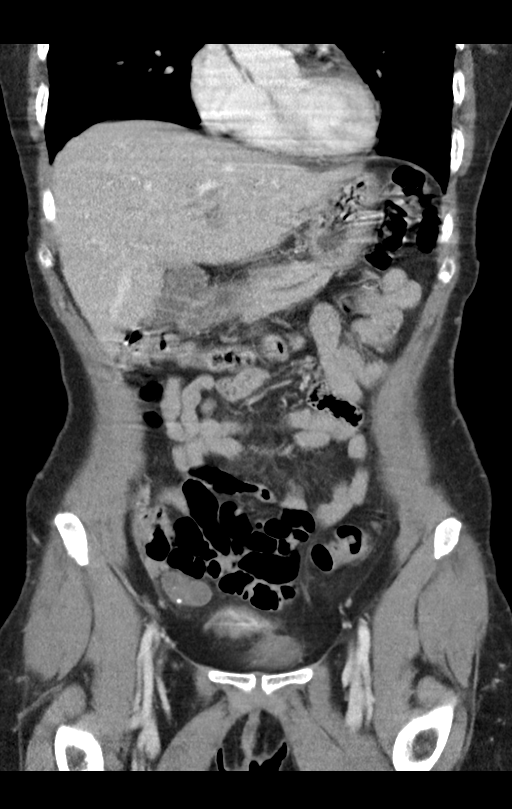
[im 27/60  soft-tissue]
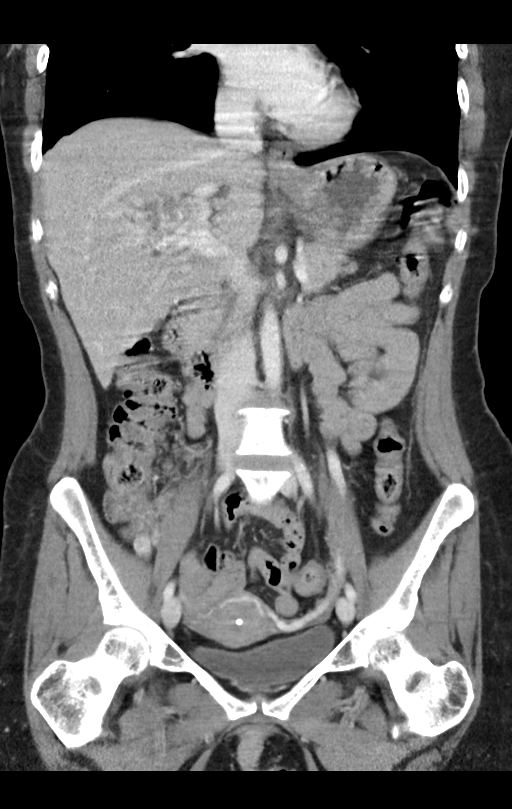
[im 33/60  soft-tissue]
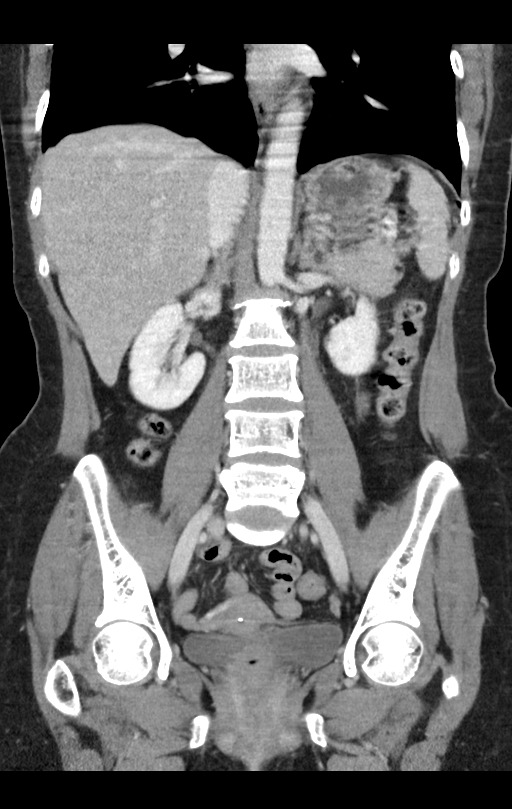

[15 of 46 positions shown; findings below may reference images not displayed]

FINDINGS: Lower chest: The visualized lung bases are grossly clear. The
visualized portions of the mediastinum are unremarkable.

Hepatobiliary: The liver is unremarkable in appearance. The
gallbladder is unremarkable in appearance. The common bile duct
remains normal in caliber.

Pancreas: The pancreas is within normal limits.

Spleen: The spleen is unremarkable in appearance.

Adrenals/Urinary Tract: The adrenal glands are unremarkable in
appearance.

Mild left renal scarring is noted. There is no evidence of
hydronephrosis. No renal or ureteral stones are identified. No
perinephric stranding is seen.

Stomach/Bowel: The stomach is unremarkable in appearance. The small
bowel is within normal limits. The appendix is normal in caliber,
without evidence of appendicitis. The colon is unremarkable in
appearance.

Vascular/Lymphatic: The abdominal aorta is unremarkable in
appearance. The inferior vena cava is grossly unremarkable. No
retroperitoneal lymphadenopathy is seen. No pelvic sidewall
lymphadenopathy is identified.

Reproductive: The bladder is mildly distended and grossly
unremarkable. The uterus is unremarkable in appearance. The
intrauterine device is noted in expected position at the fundus of
the uterus. The ovaries are relatively symmetric. No suspicious
adnexal masses are seen.

Other: No additional soft tissue abnormalities are seen.

Musculoskeletal: No acute osseous abnormalities are identified. The
visualized musculature is unremarkable in appearance.
IMPRESSION: 1. No acute abnormality seen within the abdomen or pelvis.
2. Mild left renal scarring.

## 2020-01-06 ENCOUNTER — Emergency Department
Admission: EM | Admit: 2020-01-06 | Discharge: 2020-01-06 | Disposition: A | Discharge status: Home / Self Care | Payer: BLUE CROSS/BLUE SHIELD | Attending: Emergency Medicine | Admitting: Emergency Medicine

## 2020-01-06 ENCOUNTER — Other Ambulatory Visit: Payer: Self-pay

## 2020-01-06 ENCOUNTER — Encounter: Payer: Self-pay | Admitting: Emergency Medicine

## 2020-01-06 DIAGNOSIS — R519 Headache, unspecified: Secondary | ICD-10-CM

## 2020-01-06 LAB — CBC
HCT: 39.7 % (ref 36.0–46.0)
Hemoglobin: 13.6 g/dL (ref 12.0–15.0)
MCH: 31.5 pg (ref 26.0–34.0)
MCHC: 34.3 g/dL (ref 30.0–36.0)
MCV: 91.9 fL (ref 80.0–100.0)
Platelets: 276 10*3/uL (ref 150–400)
RBC: 4.32 MIL/uL (ref 3.87–5.11)
RDW: 12.7 % (ref 11.5–15.5)
WBC: 8.3 10*3/uL (ref 4.0–10.5)
nRBC: 0 % (ref 0.0–0.2)

## 2020-01-06 LAB — BASIC METABOLIC PANEL
Anion gap: 17 — ABNORMAL HIGH (ref 5–15)
BUN: 22 mg/dL — ABNORMAL HIGH (ref 6–20)
CO2: 17 mmol/L — ABNORMAL LOW (ref 22–32)
Calcium: 9.1 mg/dL (ref 8.9–10.3)
Chloride: 103 mmol/L (ref 98–111)
Creatinine, Ser: 0.5 mg/dL (ref 0.44–1.00)
GFR calc Af Amer: >60 mL/min (ref >60)
GFR calc non Af Amer: >60 mL/min (ref >60)
Glucose, Bld: 77 mg/dL (ref 70–99)
Potassium: 3.3 mmol/L — ABNORMAL LOW (ref 3.5–5.1)
Sodium: 137 mmol/L (ref 135–145)

## 2020-01-06 MED ORDER — DIPHENHYDRAMINE HCL 50 MG/ML IJ SOLN
50.0000 mg | Freq: Once | INTRAMUSCULAR | Status: AC
Start: 2020-01-06 — End: 2020-01-06
  Administered 2020-01-06: 50 mg via INTRAVENOUS
  Filled 2020-01-06: qty 1

## 2020-01-06 MED ORDER — KETOROLAC TROMETHAMINE 30 MG/ML IJ SOLN
15.0000 mg | Freq: Once | INTRAMUSCULAR | Status: AC
  Administered 2020-01-06: 15 mg via INTRAVENOUS
  Filled 2020-01-06: qty 1

## 2020-01-06 MED ORDER — SODIUM CHLORIDE 0.9 % IV BOLUS
1000.0000 mL | Freq: Once | INTRAVENOUS | Status: AC
  Administered 2020-01-06: 1000 mL via INTRAVENOUS

## 2020-01-06 MED ORDER — METOCLOPRAMIDE HCL 5 MG/ML IJ SOLN
10.0000 mg | Freq: Once | INTRAMUSCULAR | Status: AC
  Administered 2020-01-06: 10 mg via INTRAVENOUS
  Filled 2020-01-06: qty 2

## 2020-01-06 NOTE — ED Notes (Signed)
Interpreter requested 

## 2020-01-06 NOTE — ED Triage Notes (Signed)
First nurse note- pt here for headache.  Pt only speak spanish, interpretor requested.

## 2020-01-06 NOTE — ED Notes (Signed)
Interpreter utilized for discharge instructions

## 2020-01-06 NOTE — ED Triage Notes (Signed)
Pt to ED via POV c/o Headache. Pt states that she has hx/o Migraines. Pt reports that the pain is so bad it is making her sick on her stomach. Pt has not taken anything for the pain. Pt has had headache since Monday but states that it has been worse since Wednesday. Pt is in NAD.

## 2020-01-06 NOTE — ED Provider Notes (Signed)
[facility] Emergency Department Provider Note  Time seen: 1:06 PM  I have reviewed the triage vital signs and the nursing notes.   HISTORY  Chief Complaint Headache   HPI Denise Curry is a 44 y.o. female with a past medical history of chronic headaches, neurofibromatosis, presents to the emergency department for a headache.  According to the patient she has a history of chronic headaches, states this headache has been ongoing for the past 3 to 4 days.  Patient says it feels like her normal headache but she has not been able to get rid of that at home.  Denies any fever denies any focal weakness or numbness.  Largely negative review of systems otherwise.  Does state nausea and vomiting which is typical of her headaches as well.   Past Medical History:  Diagnosis Date  . Chronic headaches   . 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 Chest pain 12/19/2016    Past Surgical History:  Procedure Laterality Date  . CESAREAN SECTION    . OVARY SURGERY      Prior to Admission medications   Medication Sig Start Date End Date Taking? Authorizing Provider  cyclobenzaprine (FLEXERIL) 5 MG tablet Take 1 tablet (5 mg total) by mouth 3 (three) times daily as needed for muscle spasms. 12/12/17   Minna Antis, MD  dicyclomine (BENTYL) 20 MG tablet Take 1 tablet (20 mg total) by mouth 3 (three) times daily as needed for spasms. 05/29/17 05/29/18  Darci Current, MD  eletriptan (RELPAX) 20 MG tablet Take 20 mg by mouth as needed for migraine or headache. May repeat in 2 hours if headache persists or recurs.    [provider]  ibuprofen (ADVIL,MOTRIN) 600 MG tablet Take 1 tablet (600 mg total) by mouth every 6 (six) hours as needed. 12/12/17   Minna Antis, MD  ketorolac (TORADOL) 10 MG tablet Take 1 tablet (10 mg total) by mouth every 6 (six) hours as needed. 04/15/17   Triplett, Rulon Eisenmenger B, FNP   metoCLOPramide (REGLAN) 10 MG tablet Take 1 tablet (10 mg total) by mouth every 8 (eight) hours as needed for up to 3 days for nausea or vomiting (Or headache). 08/15/17 08/18/17  Dionne Bucy, MD  ondansetron (ZOFRAN ODT) 4 MG disintegrating tablet Take 1 tablet (4 mg total) by mouth every 8 (eight) hours as needed for nausea or vomiting. 05/29/17   Darci Current, MD  predniSONE (DELTASONE) 10 MG tablet Take 5 tablets (50 mg total) by mouth daily. 04/15/17   Triplett, Rulon Eisenmenger B, FNP    No Known Allergies  Family History  Problem Relation Age of Onset  . Neurofibromatosis Son     Social History Social History   Tobacco Use  . Smoking status: Never Smoker  . Smokeless tobacco: Never Used  Substance Use Topics  . Alcohol use: No  . Drug use: No    Review of Systems Constitutional: Negative for fever. Cardiovascular: Negative for chest pain. Respiratory: Negative for shortness of breath. Gastrointestinal: Negative for abdominal pain.  Positive for nausea vomiting.  Negative for diarrhea. Musculoskeletal: Negative for musculoskeletal complaints Neurological: Moderate headache. All other ROS negative  ____________________________________________   PHYSICAL EXAM:  VITAL SIGNS: ED Triage Vitals  Enc Vitals Group     BP 01/06/20 1128 (!) 138/92     Pulse Rate 01/06/20 1128 95     Resp 01/06/20 1128 16  Temp 01/06/20 1133 98.4 F (36.9 C)     Temp Source 01/06/20 1133 Oral     SpO2 01/06/20 1128 99 %     Weight --      Height --      Head Circumference --      Peak Flow --      Pain Score 01/06/20 1130 10     Pain Loc --      Pain Edu? --      Excl. in [place]? --     Constitutional: Alert, well-appearing and in no distress. Eyes: Normal exam ENT      Head: Normocephalic and atraumatic.      Mouth/Throat: Mucous membranes are moist. Cardiovascular: Normal rate, regular rhythm. No murmur Respiratory: Normal respiratory effort without tachypnea nor  retractions. Breath sounds are clear  Gastrointestinal: Soft and nontender. No distention.  Musculoskeletal: Nontender with normal range of motion in all extremities. Neurologic:  Normal speech and language. No gross focal neurologic deficits  Skin:  Skin is warm, dry and intact.  Psychiatric: Mood and affect are normal.    INITIAL IMPRESSION / ASSESSMENT AND PLAN / ED COURSE  Pertinent labs & imaging results that were available during my care of the patient were reviewed by me and considered in my medical decision making (see chart for details).   Patient presents to the emergency department for headache.  States she has chronic headaches to which this feels identical but it is not going away.  States some nausea and vomiting which she relates to the headache which is typical for her headaches.  Patient states she has had some weakness but describes more of a diffuse weakness denies any focal deficits.  Given the patient's history of chronic headaches we will check basic labs, treat with Toradol Reglan Benadryl and IV fluids and continue to closely monitor.  Patient agreeable to plan of care.  No concerning symptoms/red flags on examination.  Labs show mild dehydration.  Patient receiving IV fluids.  Patient states her headache is better.  Discussed with the patient to continue oral hydration at home.  Patient agreeable to plan of care.  Denise Curry was evaluated in Emergency Department on 01/06/2020 for the symptoms described in the history of present illness. She was evaluated in the context of the global COVID-19 pandemic, which necessitated consideration that the patient might be at risk for infection with the SARS-CoV-2 virus that causes COVID-19. Institutional protocols and algorithms that pertain to the evaluation of patients at risk for COVID-19 are in a state of rapid change based on information released by regulatory bodies including the CDC and federal and state  organizations. These policies and algorithms were followed during the patient's care in the ED.  ____________________________________________   FINAL CLINICAL IMPRESSION(S) / ED DIAGNOSES  Headache   Harvest Dark, MD 01/06/20 1455

## 2020-01-14 ENCOUNTER — Emergency Department: Payer: BLUE CROSS/BLUE SHIELD

## 2020-01-14 ENCOUNTER — Other Ambulatory Visit: Payer: Self-pay

## 2020-01-14 ENCOUNTER — Emergency Department
Admission: EM | Admit: 2020-01-14 | Discharge: 2020-01-14 | Disposition: A | Discharge status: Home / Self Care | Payer: BLUE CROSS/BLUE SHIELD | Attending: Emergency Medicine | Admitting: Emergency Medicine

## 2020-01-14 ENCOUNTER — Encounter: Payer: Self-pay | Admitting: Emergency Medicine

## 2020-01-14 DIAGNOSIS — G43819 Other migraine, intractable, without status migrainosus: Secondary | ICD-10-CM | POA: Diagnosis not present

## 2020-01-14 DIAGNOSIS — Q8500 Neurofibromatosis, unspecified: Secondary | ICD-10-CM | POA: Diagnosis not present

## 2020-01-14 DIAGNOSIS — R519 Headache, unspecified: Secondary | ICD-10-CM | POA: Diagnosis present

## 2020-01-14 LAB — CBC WITH DIFFERENTIAL/PLATELET
Abs Immature Granulocytes: 0.03 10*3/uL (ref 0.00–0.07)
Basophils Absolute: 0.1 10*3/uL (ref 0.0–0.1)
Basophils Relative: 1 %
Eosinophils Absolute: 0 10*3/uL (ref 0.0–0.5)
Eosinophils Relative: 0 %
HCT: 38.6 % (ref 36.0–46.0)
Hemoglobin: 13.4 g/dL (ref 12.0–15.0)
Immature Granulocytes: 0 %
Lymphocytes Relative: 11 %
Lymphs Abs: 0.8 10*3/uL (ref 0.7–4.0)
MCH: 31.8 pg (ref 26.0–34.0)
MCHC: 34.7 g/dL (ref 30.0–36.0)
MCV: 91.5 fL (ref 80.0–100.0)
Monocytes Absolute: 0.4 10*3/uL (ref 0.1–1.0)
Monocytes Relative: 6 %
Neutro Abs: 5.9 10*3/uL (ref 1.7–7.7)
Neutrophils Relative %: 82 %
Platelets: 293 10*3/uL (ref 150–400)
RBC: 4.22 MIL/uL (ref 3.87–5.11)
RDW: 12.9 % (ref 11.5–15.5)
WBC: 7.2 10*3/uL (ref 4.0–10.5)
nRBC: 0 % (ref 0.0–0.2)

## 2020-01-14 LAB — COMPREHENSIVE METABOLIC PANEL
ALT: 28 U/L (ref 0–44)
AST: 30 U/L (ref 15–41)
Albumin: 4.3 g/dL (ref 3.5–5.0)
Alkaline Phosphatase: 54 U/L (ref 38–126)
Anion gap: 10 (ref 5–15)
BUN: 12 mg/dL (ref 6–20)
CO2: 24 mmol/L (ref 22–32)
Calcium: 9.3 mg/dL (ref 8.9–10.3)
Chloride: 104 mmol/L (ref 98–111)
Creatinine, Ser: 0.39 mg/dL — ABNORMAL LOW (ref 0.44–1.00)
GFR calc Af Amer: >60 mL/min (ref >60)
GFR calc non Af Amer: >60 mL/min (ref >60)
Glucose, Bld: 107 mg/dL — ABNORMAL HIGH (ref 70–99)
Potassium: 3.7 mmol/L (ref 3.5–5.1)
Sodium: 138 mmol/L (ref 135–145)
Total Bilirubin: 0.4 mg/dL (ref 0.3–1.2)
Total Protein: 7.4 g/dL (ref 6.5–8.1)

## 2020-01-14 MED ORDER — FENTANYL CITRATE (PF) 100 MCG/2ML IJ SOLN
100.0000 ug | Freq: Once | INTRAMUSCULAR | Status: AC
  Administered 2020-01-14: 100 ug via INTRAVENOUS
  Filled 2020-01-14: qty 2

## 2020-01-14 MED ORDER — METOCLOPRAMIDE HCL 5 MG/ML IJ SOLN
10.0000 mg | Freq: Once | INTRAMUSCULAR | Status: AC
  Administered 2020-01-14: 10 mg via INTRAVENOUS
  Filled 2020-01-14: qty 2

## 2020-01-14 MED ORDER — SODIUM CHLORIDE 0.9 % IV BOLUS
1000.0000 mL | Freq: Once | INTRAVENOUS | Status: AC
  Administered 2020-01-14: 1000 mL via INTRAVENOUS

## 2020-01-14 MED ORDER — KETOROLAC TROMETHAMINE 30 MG/ML IJ SOLN
15.0000 mg | Freq: Once | INTRAMUSCULAR | Status: AC
Start: 2020-01-14 — End: 2020-01-14
  Administered 2020-01-14: 15 mg via INTRAVENOUS
  Filled 2020-01-14: qty 1

## 2020-01-14 MED ORDER — DIPHENHYDRAMINE HCL 50 MG/ML IJ SOLN
12.5000 mg | Freq: Once | INTRAMUSCULAR | Status: AC
  Administered 2020-01-14: 12.5 mg via INTRAVENOUS
  Filled 2020-01-14: qty 1

## 2020-01-14 MED ORDER — BUTALBITAL-APAP-CAFFEINE 50-325-40 MG PO TABS: ORAL_TABLET | ORAL | 0 refills | Status: AC

## 2020-01-14 MED ORDER — DEXAMETHASONE SODIUM PHOSPHATE 10 MG/ML IJ SOLN
10.0000 mg | Freq: Once | INTRAMUSCULAR | Status: AC
  Administered 2020-01-14: 10 mg via INTRAVENOUS
  Filled 2020-01-14: qty 1

## 2020-01-14 NOTE — ED Notes (Signed)
Interpreter requested 

## 2020-01-14 NOTE — ED Triage Notes (Signed)
Pt to ER with c/o headache with n/v.  PT has hx of same.  PT reports headache for several days.

## 2020-01-14 NOTE — ED Provider Notes (Signed)
[facility] Emergency Department Provider Note ____________________________________________  Time seen: Approximately 12:48 PM  I have reviewed the triage vital signs and the nursing notes.   HISTORY  Chief Complaint Headache   HPI Denise Curry is a 44 y.o. female presenting to the emergency department for treatment and evaluation of headache for the past several days..   Location: Frontal Similar to previous headaches: Yes Duration: Several days TIMING: Gradual onset SEVERITY: 8/10 QUALITY: "Bad" CONTEXT: No known exposures MODIFYING FACTORS: No relief with Excedrin ASSOCIATED SYMPTOMS: Nausea  Interpreter utilized for assessment and exam  Past Medical History:  Diagnosis Date  . Chronic headaches   . 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 Chest pain 12/19/2016    Past Surgical History:  Procedure Laterality Date  . CESAREAN SECTION    . OVARY SURGERY      Prior to Admission medications   Medication Sig Start Date End Date Taking? Authorizing Provider  butalbital-acetaminophen-caffeine (FIORICET) 50-325-40 MG tablet 1-2 tablets every 6 hours as needed for headache 01/14/20   Triplett, Cari B, FNP  cyclobenzaprine (FLEXERIL) 5 MG tablet Take 1 tablet (5 mg total) by mouth 3 (three) times daily as needed for muscle spasms. 12/12/17   Minna Antis, MD  dicyclomine (BENTYL) 20 MG tablet Take 1 tablet (20 mg total) by mouth 3 (three) times daily as needed for spasms. 05/29/17 05/29/18  Darci Current, MD  eletriptan (RELPAX) 20 MG tablet Take 20 mg by mouth as needed for migraine or headache. May repeat in 2 hours if headache persists or recurs.    [provider]  ibuprofen (ADVIL,MOTRIN) 600 MG tablet Take 1 tablet (600 mg total) by mouth every 6 (six) hours as needed. 12/12/17   Minna Antis, MD  ketorolac (TORADOL) 10 MG tablet Take 1 tablet (10 mg total) by mouth  every 6 (six) hours as needed. 04/15/17   Triplett, Rulon Eisenmenger B, FNP  metoCLOPramide (REGLAN) 10 MG tablet Take 1 tablet (10 mg total) by mouth every 8 (eight) hours as needed for up to 3 days for nausea or vomiting (Or headache). 08/15/17 08/18/17  Dionne Bucy, MD  ondansetron (ZOFRAN ODT) 4 MG disintegrating tablet Take 1 tablet (4 mg total) by mouth every 8 (eight) hours as needed for nausea or vomiting. 05/29/17   Darci Current, MD  predniSONE (DELTASONE) 10 MG tablet Take 5 tablets (50 mg total) by mouth daily. 04/15/17   Chinita Pester, FNP    Allergies Patient has no known allergies.  Family History  Problem Relation Age of Onset  . Neurofibromatosis Son     Social History Social History   Tobacco Use  . Smoking status: Never Smoker  . Smokeless tobacco: Never Used  Substance Use Topics  . Alcohol use: No  . Drug use: No    Review of Systems Constitutional: No fever/chills or recent injury. Eyes: No visual changes. ENT: No sore throat. Respiratory: Denies shortness of breath. Gastrointestinal: No abdominal pain.  No nausea, no vomiting.  No diarrhea.  No constipation. Musculoskeletal: Negative for pain. Skin: Negative for rash. Neurological:Positive for headache, negative for focal weakness or numbness. No confusion or fainting. ___________________________________________   PHYSICAL EXAM:  VITAL SIGNS: ED Triage Vitals  Enc Vitals Group     BP 01/14/20 1100 (!) 123/93     Pulse Rate 01/14/20 1100 (!) 106     Resp 01/14/20 1100 18  Temp 01/14/20 1100 98.1 F (36.7 C)     Temp Source 01/14/20 1100 Oral     SpO2 01/14/20 1100 98 %     Weight 01/14/20 1101 119 lb 14.9 oz (54.4 kg)     Height 01/14/20 1101 5\' 4"  (1.626 m)     Head Circumference --      Peak Flow --      Pain Score --      Pain Loc --      Pain Edu? --      Excl. in GC? --     Constitutional: Alert and oriented. Well appearing and in no acute distress. Eyes: Conjunctivae are  normal. PERRL. EOMI without expressed pain. No evidence of papilledema on limited exam. Head: Atraumatic. Nose: No congestion/rhinnorhea. Mouth/Throat: Mucous membranes are moist.  Oropharynx non-erythematous. Neck: No stridor. Supple, no meningismus.  Cardiovascular: Normal rate, regular rhythm. Grossly normal heart sounds.  Good peripheral circulation. Respiratory: Normal respiratory effort.  No retractions. Lungs CTAB. Gastrointestinal: Soft and nontender. No distention.  Musculoskeletal: No lower extremity tenderness nor edema.  No joint effusions. Neurologic:  Normal speech and language. No gross focal neurologic deficits are appreciated. No gait instability. Cranial nerves: 2-10 normal as tested. Cerebellar:Normal Romberg, finger-nose-finger, heel to shin, normal gait. Sensorimotor: No aphasia, pronator drift, clonus, sensory loss or abnormal reflexes.  Skin:  Skin is warm, dry and intact. No rash noted. Psychiatric: Mood and affect are normal. Speech and behavior are normal. Normal thought process and cognition.  ____________________________________________   LABS (all labs ordered are listed, but only abnormal results are displayed)  Labs Reviewed  COMPREHENSIVE METABOLIC PANEL - Abnormal; Notable for the following components:      Result Value   Glucose, Bld 107 (*)    Creatinine, Ser 0.39 (*)    All other components within normal limits  CBC WITH DIFFERENTIAL/PLATELET   ____________________________________________  EKG  Not indicated. ____________________________________________  RADIOLOGY  CT Head Wo Contrast  Result Date: 01/14/2020 CLINICAL DATA:  Pt to ER with c/o headache with n/v. PT has hx of same. PT reports headache for several days. EXAM: CT HEAD WITHOUT CONTRAST TECHNIQUE: Contiguous axial images were obtained from the base of the skull through the vertex without intravenous contrast. COMPARISON:  CT head 12/18/2016 FINDINGS: Brain: No evidence of acute  infarction, hemorrhage, hydrocephalus, extra-axial collection or mass lesion/mass effect. Vascular: No hyperdense vessel or unexpected calcification. Skull: Normal. Negative for fracture or focal lesion. Sinuses/Orbits: No acute finding. Other: None. IMPRESSION: No acute intracranial process. Electronically Signed   By: 02/17/2017 M.D.   On: 01/14/2020 13:54   ____________________________________________   PROCEDURES  Procedure(s) performed:  Procedures  Critical Care performed: None ____________________________________________   INITIAL IMPRESSION / ASSESSMENT AND PLAN / ED COURSE  44 year old female presenting to the emergency department for treatment and evaluation of headache.  See HPI for further details.  Plan will be to get some labs, give headache cocktail and obtain a CT of her head.  2:49 PM CT scan of her head is normal.  She continues to have pain.  Fentanyl ordered.  3:50 PM headache nearly completely resolved.  Patient states that she feels that she is ready for discharge.  She will be prescribed.  And advised to follow-up with her primary care provider or return to the emergency department for recurrent headache or other symptoms of concern.  Pertinent labs & imaging results that were available during my care of the patient were reviewed by me and  considered in my medical decision making (see chart for details). ____________________________________________   FINAL CLINICAL IMPRESSION(S) / ED DIAGNOSES  Final diagnoses:  Other migraine without status migrainosus, intractable    ED Discharge Orders         Ordered    butalbital-acetaminophen-caffeine (FIORICET) 50-325-40 MG tablet     01/14/20 [place], Brownlee B, FNP 01/14/20 1836    Blake Divine, MD 01/15/20 2048

## 2022-11-24 ENCOUNTER — Encounter: Payer: Self-pay | Admitting: Primary Care

## 2022-12-04 ENCOUNTER — Other Ambulatory Visit: Payer: Self-pay

## 2022-12-04 DIAGNOSIS — Z1231 Encounter for screening mammogram for malignant neoplasm of breast: Secondary | ICD-10-CM

## 2022-12-21 ENCOUNTER — Ambulatory Visit: Payer: Self-pay

## 2023-02-08 ENCOUNTER — Ambulatory Visit: Payer: Self-pay | Attending: Obstetrics and Gynecology

## 2023-02-08 ENCOUNTER — Ambulatory Visit: Payer: Self-pay

## 2023-12-02 ENCOUNTER — Telehealth: Payer: Self-pay | Admitting: *Deleted

## 2023-12-03 ENCOUNTER — Encounter: Payer: Self-pay | Admitting: Primary Care
